# Patient Record
Sex: Male | Born: 1945 | Race: White | Hispanic: No | Smoking: Never smoker
Health system: Southern US, Community
[De-identification: ages and names within clinical notes are randomized; demographics above are authoritative.]

---

## 2019-01-21 ENCOUNTER — Other Ambulatory Visit: Payer: Self-pay

## 2019-01-21 ENCOUNTER — Inpatient Hospital Stay: Payer: Medicare Other

## 2019-01-21 ENCOUNTER — Emergency Department: Payer: Medicare Other

## 2019-01-21 ENCOUNTER — Inpatient Hospital Stay
Admission: EM | Admit: 2019-01-21 | Discharge: 2019-02-03 | DRG: 330 | Disposition: A | Discharge status: Skilled Nursing Facility | Payer: Medicare Other | Attending: Surgery | Admitting: Surgery

## 2019-01-21 DIAGNOSIS — R1084 Generalized abdominal pain: Principal | ICD-10-CM

## 2019-01-21 DIAGNOSIS — R4781 Slurred speech: Secondary | ICD-10-CM | POA: Diagnosis present

## 2019-01-21 DIAGNOSIS — E876 Hypokalemia: Secondary | ICD-10-CM | POA: Diagnosis present

## 2019-01-21 DIAGNOSIS — Z20828 Contact with and (suspected) exposure to other viral communicable diseases: Secondary | ICD-10-CM | POA: Diagnosis present

## 2019-01-21 DIAGNOSIS — I714 Abdominal aortic aneurysm, without rupture, unspecified: Secondary | ICD-10-CM

## 2019-01-21 DIAGNOSIS — K56609 Unspecified intestinal obstruction, unspecified as to partial versus complete obstruction: Secondary | ICD-10-CM | POA: Diagnosis present

## 2019-01-21 DIAGNOSIS — Z0279 Encounter for issue of other medical certificate: Secondary | ICD-10-CM | POA: Diagnosis present

## 2019-01-21 DIAGNOSIS — R188 Other ascites: Secondary | ICD-10-CM | POA: Diagnosis present

## 2019-01-21 DIAGNOSIS — Z5329 Procedure and treatment not carried out because of patient's decision for other reasons: Secondary | ICD-10-CM | POA: Diagnosis present

## 2019-01-21 DIAGNOSIS — Z0189 Encounter for other specified special examinations: Secondary | ICD-10-CM | POA: Diagnosis present

## 2019-01-21 DIAGNOSIS — K660 Peritoneal adhesions (postprocedural) (postinfection): Secondary | ICD-10-CM | POA: Diagnosis present

## 2019-01-21 DIAGNOSIS — K5730 Diverticulosis of large intestine without perforation or abscess without bleeding: Secondary | ICD-10-CM | POA: Diagnosis present

## 2019-01-21 DIAGNOSIS — Z638 Other specified problems related to primary support group: Secondary | ICD-10-CM | POA: Diagnosis not present

## 2019-01-21 DIAGNOSIS — I440 Atrioventricular block, first degree: Secondary | ICD-10-CM | POA: Diagnosis present

## 2019-01-21 DIAGNOSIS — R0902 Hypoxemia: Secondary | ICD-10-CM

## 2019-01-21 DIAGNOSIS — K08109 Complete loss of teeth, unspecified cause, unspecified class: Secondary | ICD-10-CM | POA: Diagnosis present

## 2019-01-21 DIAGNOSIS — Z880 Allergy status to penicillin: Secondary | ICD-10-CM

## 2019-01-21 DIAGNOSIS — I723 Aneurysm of iliac artery: Secondary | ICD-10-CM | POA: Diagnosis present

## 2019-01-21 DIAGNOSIS — K838 Other specified diseases of biliary tract: Secondary | ICD-10-CM | POA: Diagnosis present

## 2019-01-21 DIAGNOSIS — R911 Solitary pulmonary nodule: Secondary | ICD-10-CM | POA: Diagnosis present

## 2019-01-21 DIAGNOSIS — R110 Nausea: Secondary | ICD-10-CM | POA: Diagnosis present

## 2019-01-21 DIAGNOSIS — F4320 Adjustment disorder, unspecified: Secondary | ICD-10-CM | POA: Diagnosis present

## 2019-01-21 DIAGNOSIS — Z590 Homelessness: Secondary | ICD-10-CM | POA: Diagnosis not present

## 2019-01-21 DIAGNOSIS — Z9049 Acquired absence of other specified parts of digestive tract: Secondary | ICD-10-CM | POA: Diagnosis not present

## 2019-01-21 DIAGNOSIS — I959 Hypotension, unspecified: Secondary | ICD-10-CM | POA: Diagnosis not present

## 2019-01-21 DIAGNOSIS — R112 Nausea with vomiting, unspecified: Secondary | ICD-10-CM

## 2019-01-21 LAB — CBC WITH DIFFERENTIAL/PLATELET
Abs Immature Granulocytes: 0.03 10*3/uL (ref 0.00–0.07)
Basophils Absolute: 0 10*3/uL (ref 0.0–0.1)
Basophils Relative: 0 %
Eosinophils Absolute: 0 10*3/uL (ref 0.0–0.5)
Eosinophils Relative: 0 %
HCT: 39 % (ref 39.0–52.0)
Hemoglobin: 12.9 g/dL — ABNORMAL LOW (ref 13.0–17.0)
Immature Granulocytes: 0 %
Lymphocytes Relative: 8 %
Lymphs Abs: 0.6 10*3/uL — ABNORMAL LOW (ref 0.7–4.0)
MCH: 30.4 pg (ref 26.0–34.0)
MCHC: 33.1 g/dL (ref 30.0–36.0)
MCV: 91.8 fL (ref 80.0–100.0)
Monocytes Absolute: 0.4 10*3/uL (ref 0.1–1.0)
Monocytes Relative: 5 %
Neutro Abs: 6.6 10*3/uL (ref 1.7–7.7)
Neutrophils Relative %: 87 %
Platelets: 169 10*3/uL (ref 150–400)
RBC: 4.25 MIL/uL (ref 4.22–5.81)
RDW: 13.5 % (ref 11.5–15.5)
WBC: 7.7 10*3/uL (ref 4.0–10.5)
nRBC: 0 % (ref 0.0–0.2)

## 2019-01-21 LAB — COMPREHENSIVE METABOLIC PANEL
ALT: 22 U/L (ref 0–44)
AST: 25 U/L (ref 15–41)
Albumin: 4 g/dL (ref 3.5–5.0)
Alkaline Phosphatase: 63 U/L (ref 38–126)
Anion gap: 9 (ref 5–15)
BUN: 18 mg/dL (ref 8–23)
CO2: 29 mmol/L (ref 22–32)
Calcium: 9.3 mg/dL (ref 8.9–10.3)
Chloride: 101 mmol/L (ref 98–111)
Creatinine, Ser: 0.99 mg/dL (ref 0.61–1.24)
GFR calc Af Amer: >60 mL/min (ref >60)
GFR calc non Af Amer: >60 mL/min (ref >60)
Glucose, Bld: 145 mg/dL — ABNORMAL HIGH (ref 70–99)
Potassium: 3.1 mmol/L — ABNORMAL LOW (ref 3.5–5.1)
Sodium: 139 mmol/L (ref 135–145)
Total Bilirubin: 1.1 mg/dL (ref 0.3–1.2)
Total Protein: 6.9 g/dL (ref 6.5–8.1)

## 2019-01-21 LAB — URINE DRUG SCREEN, QUALITATIVE (ARMC ONLY)
Amphetamines, Ur Screen: NOT DETECTED
Barbiturates, Ur Screen: NOT DETECTED
Benzodiazepine, Ur Scrn: NOT DETECTED
Cannabinoid 50 Ng, Ur ~~LOC~~: NOT DETECTED
Cocaine Metabolite,Ur ~~LOC~~: NOT DETECTED
MDMA (Ecstasy)Ur Screen: NOT DETECTED
Methadone Scn, Ur: NOT DETECTED
Opiate, Ur Screen: POSITIVE — AB
Phencyclidine (PCP) Ur S: NOT DETECTED
Tricyclic, Ur Screen: NOT DETECTED

## 2019-01-21 LAB — ETHANOL: Alcohol, Ethyl (B): <10 mg/dL (ref <10)

## 2019-01-21 LAB — LIPASE, BLOOD: Lipase: 28 U/L (ref 11–51)

## 2019-01-21 IMAGING — CT CT ABD-PELV W/O CM
2 of 4 series · 15 of 46 positions shown, 17 images · non-contrast
Comparison: None.

CLINICAL DATA: Onset vomiting today.

EXAM:
CT ABDOMEN AND PELVIS WITHOUT CONTRAST
TECHNIQUE: Multidetector CT imaging of the abdomen and pelvis was performed
following the standard protocol without IV contrast.

[Series 2: routine abd/pel wo · axial · 0.67mm/px · z∈[-442,+3]mm · 12 of 99 slices shown, 14 images]
[im 5/99  soft-tissue]
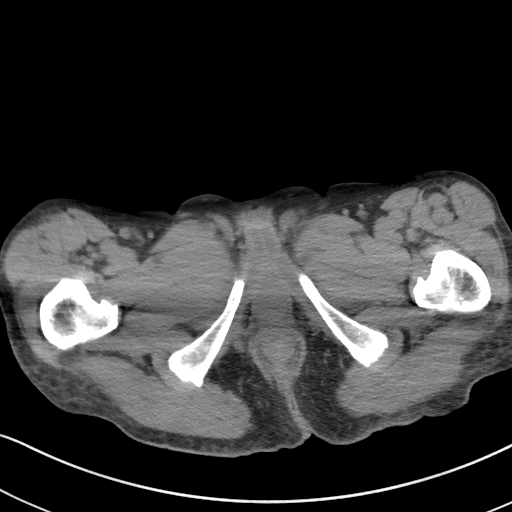
[im 5/99  bone]
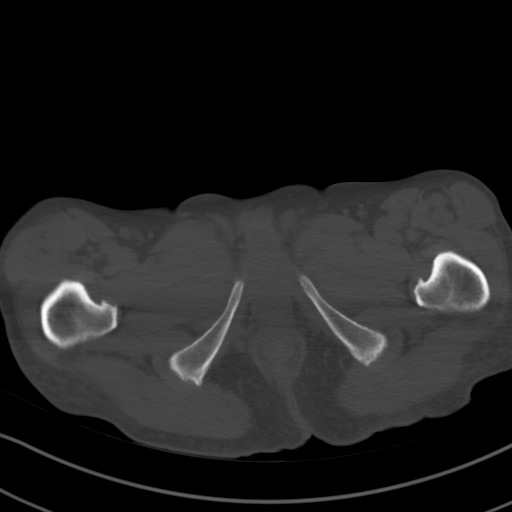
[im 13/99  soft-tissue]
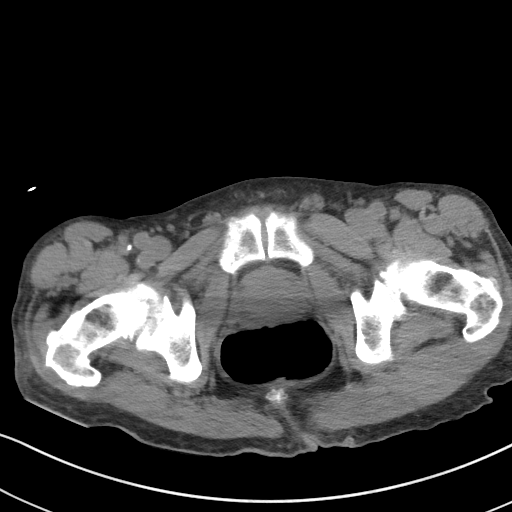
[im 21/99  soft-tissue]
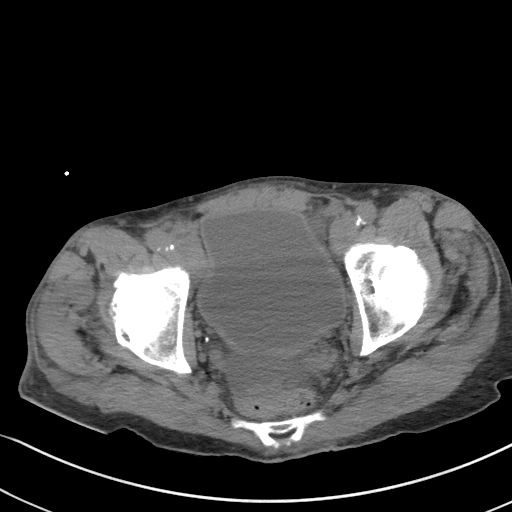
[im 29/99  soft-tissue]
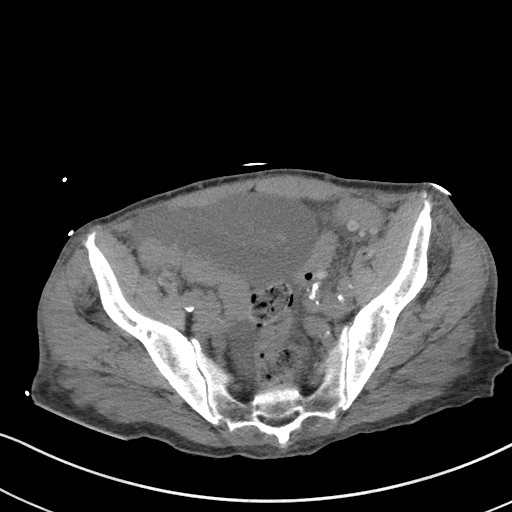
[im 37/99  soft-tissue]
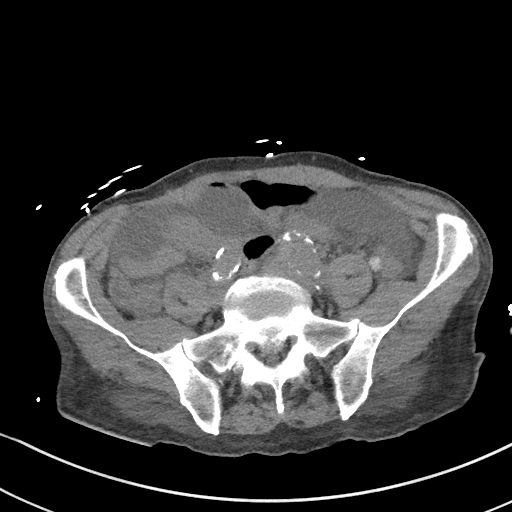
[im 45/99  soft-tissue]
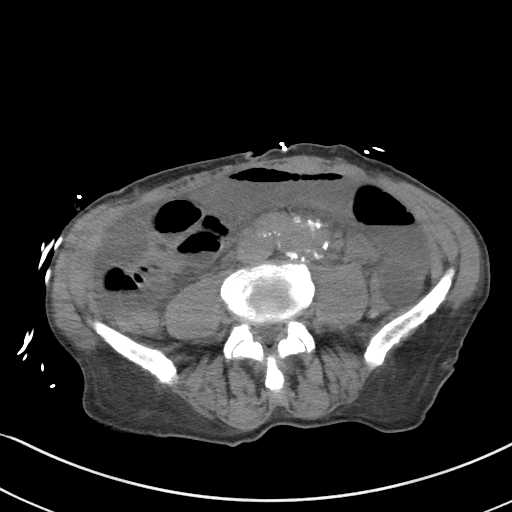
[im 54/99  soft-tissue]
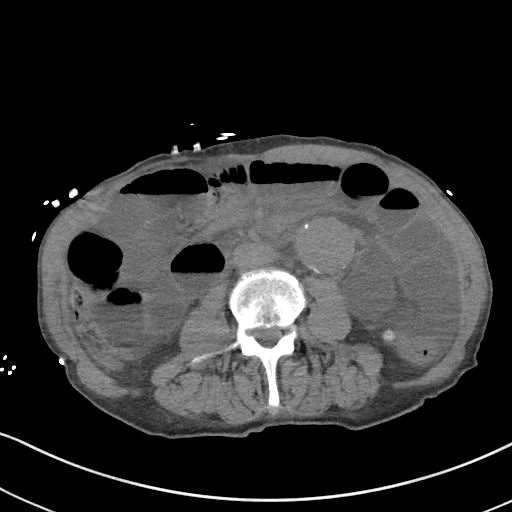
[im 62/99  soft-tissue]
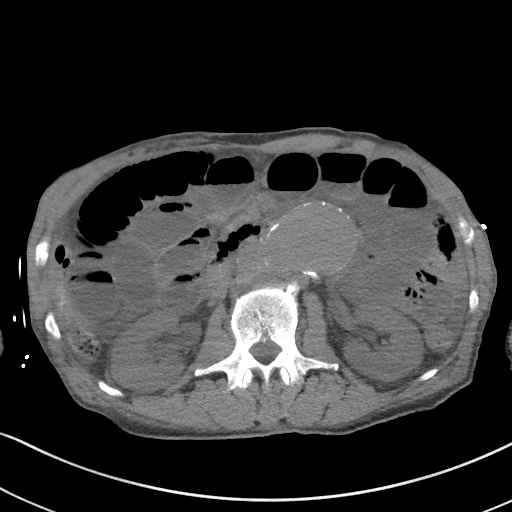
[im 70/99  soft-tissue]
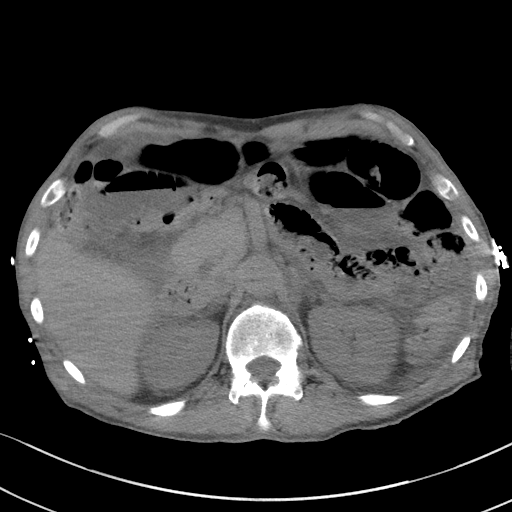
[im 70/99  bone]
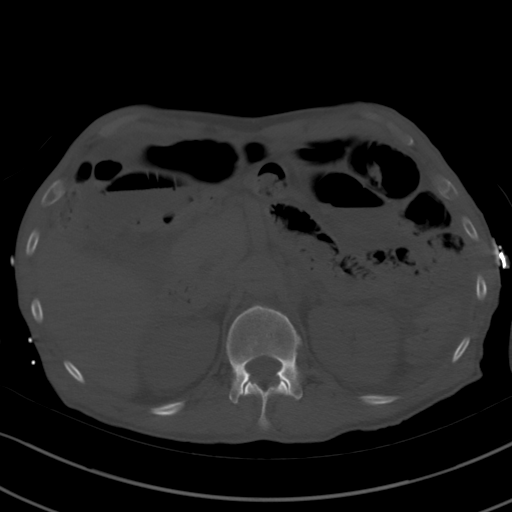
[im 78/99  soft-tissue]
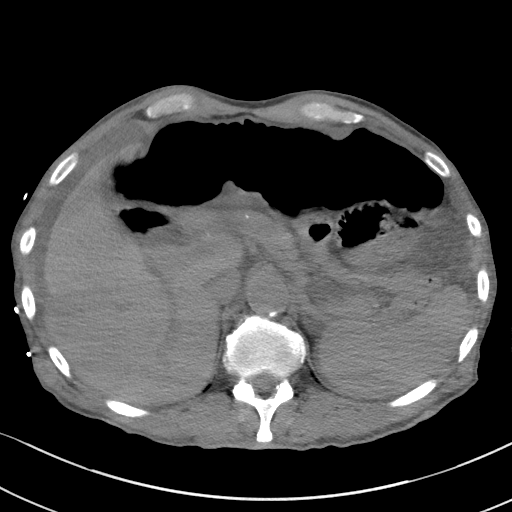
[im 86/99  soft-tissue]
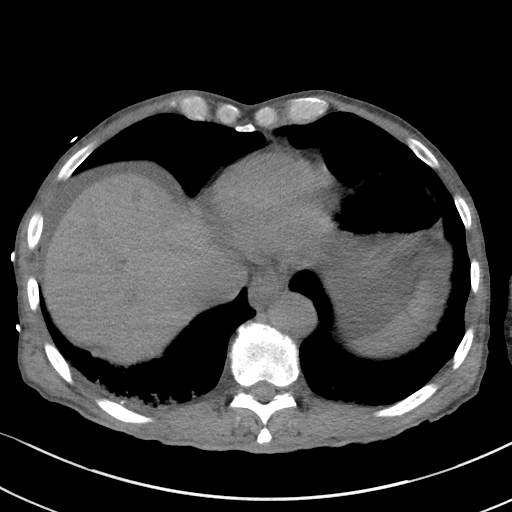
[im 94/99  soft-tissue]
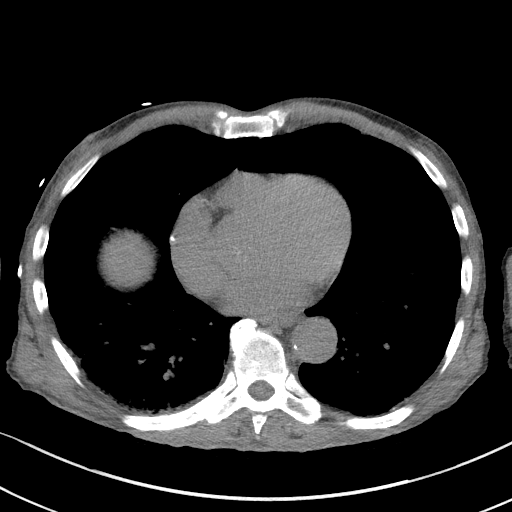

[Series 5: coronal st · coronal · 0.84mm/px · 3 of 90 slices shown]
[im 30/90  soft-tissue]
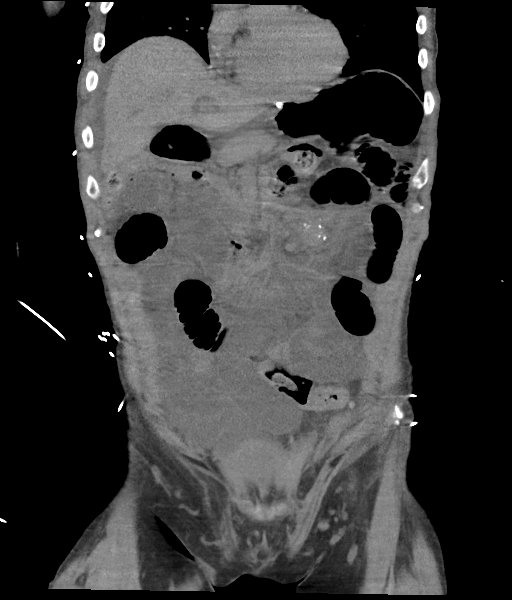
[im 40/90  soft-tissue]
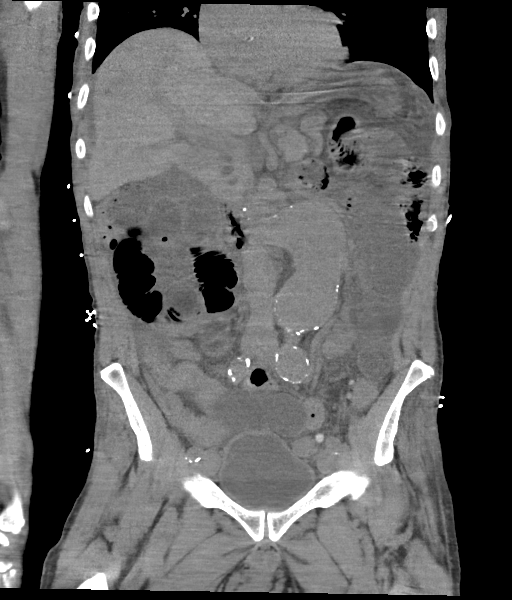
[im 50/90  soft-tissue]
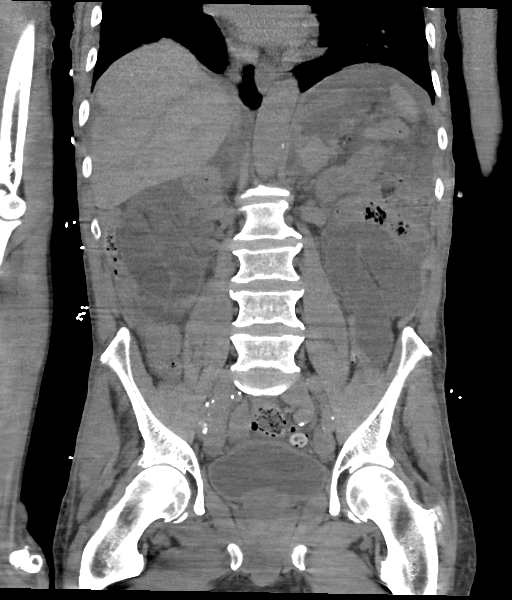

[15 of 46 positions shown; findings below may reference images not displayed]

FINDINGS: Lower chest: Lung bases demonstrate emphysema. There is some
dependent atelectasis. A right lower lobe nodule measuring 1.9 cm in
diameter is seen on image 12 of series 3. The lesion is unchanged
since the [DATE] CT. Punctate calcified granulomata are also
seen in both lower lobes.

Hepatobiliary: No focal liver lesion. The gallbladder is difficult
to visualize and may be completely decompressed. No surgical history
is provided but the patient could be status post cholecystectomy.

Pancreas: Unremarkable. No pancreatic ductal dilatation or
surrounding inflammatory changes.

Spleen: Normal in size without focal abnormality.

Adrenals/Urinary Tract: There is thickening of the adrenal glands
bilaterally compatible with hyperplasia. The kidneys and ureters are
unremarkable. Urinary bladder appears normal.

Stomach/Bowel: Small bowel loops are dilated up to approximately 4
cm with air-fluid levels present. Distal small bowel loops are
decompressed. No pneumatosis, portal venous gas or free
intraperitoneal air is identified. Transition point is not
identified. The colon is largely decompressed. Scattered diverticula
are noted without diverticulitis. The stomach is unremarkable.

Vascular/Lymphatic: Extensive aortic atherosclerosis. The aorta is
markedly tortuous aortic aneurysm measuring up to 5.2 cm in
diameter. Left common iliac artery aneurysm measures 2.6 cm and the
right common iliac artery measures 1.6 cm. No evidence of leak.

Reproductive: Prostate gland is unremarkable.

Other: There is a small volume abdominal and pelvic ascites. No free
intraperitoneal air.

Musculoskeletal: No acute or focal bony abnormality. Right worse
than left hip osteoarthritis is noted.
IMPRESSION: Findings most consistent with small bowel obstruction. No CT signs
of ischemia on uninfused exam.

Small volume of ascites is presumably related to bowel obstruction.

5.2 cm abdominal aortic aneurysm. No evidence of hemorrhage.
Bilateral common iliac artery aneurysms are also seen. Recommend
followup by abdomen and pelvis CTA in 3-6 months, and vascular
surgery referral/consultation if not already obtained. This
recommendation follows ACR consensus guidelines: White Paper of the
ACR Incidental Findings Committee II on Vascular Findings. [HOSPITAL] [AY]; [DATE]. Aortic aneurysm NOS ([AY]-[AY])

1.9 cm in diameter right lower lobe pulmonary nodule is unchanged
since [DATE]. Recommend chest CT scan in 6-12 months.

Emphysema.

## 2019-01-21 MED ORDER — LACTATED RINGERS IV SOLN
INTRAVENOUS | Status: DC
  Administered 2019-01-21 – 2019-01-22 (×3): via INTRAVENOUS

## 2019-01-21 MED ORDER — LIDOCAINE HCL (PF) 1 % IJ SOLN
5.0000 mL | Freq: Once | INTRAMUSCULAR | Status: AC
  Administered 2019-01-21: 5 mL
  Filled 2019-01-21: qty 5

## 2019-01-21 MED ORDER — ONDANSETRON HCL 4 MG/2ML IJ SOLN
4.0000 mg | Freq: Four times a day (QID) | INTRAMUSCULAR | Status: DC | PRN
  Administered 2019-01-22 – 2019-01-29 (×6): 4 mg via INTRAVENOUS
  Filled 2019-01-21 (×6): qty 2

## 2019-01-21 MED ORDER — ENOXAPARIN SODIUM 40 MG/0.4ML ~~LOC~~ SOLN
40.0000 mg | SUBCUTANEOUS | Status: DC
  Administered 2019-01-22 – 2019-01-25 (×3): 40 mg via SUBCUTANEOUS
  Filled 2019-01-21 (×4): qty 0.4

## 2019-01-21 MED ORDER — ONDANSETRON 4 MG PO TBDP: 4.0000 mg | ORAL_TABLET | Freq: Four times a day (QID) | ORAL | Status: DC | PRN

## 2019-01-21 MED ORDER — IOHEXOL 350 MG/ML SOLN
100.0000 mL | Freq: Once | INTRAVENOUS | Status: AC | PRN
  Administered 2019-01-21: 100 mL via INTRAVENOUS

## 2019-01-21 MED ORDER — DIATRIZOATE MEGLUMINE & SODIUM 66-10 % PO SOLN: 90.0000 mL | Freq: Once | ORAL | Status: DC

## 2019-01-21 MED ORDER — LORAZEPAM 2 MG/ML IJ SOLN
2.0000 mg | Freq: Once | INTRAMUSCULAR | Status: AC
  Administered 2019-01-21: 2 mg via INTRAVENOUS
  Filled 2019-01-21: qty 1

## 2019-01-21 MED ORDER — ONDANSETRON HCL 4 MG/2ML IJ SOLN
4.0000 mg | Freq: Once | INTRAMUSCULAR | Status: AC
  Administered 2019-01-21: 4 mg via INTRAVENOUS
  Filled 2019-01-21: qty 2

## 2019-01-21 MED ORDER — MORPHINE SULFATE (PF) 4 MG/ML IV SOLN
4.0000 mg | Freq: Once | INTRAVENOUS | Status: AC
  Administered 2019-01-21: 4 mg via INTRAVENOUS
  Filled 2019-01-21: qty 1

## 2019-01-21 MED ORDER — HALOPERIDOL LACTATE 5 MG/ML IJ SOLN
5.0000 mg | Freq: Once | INTRAMUSCULAR | Status: AC
  Administered 2019-01-21: 5 mg via INTRAVENOUS
  Filled 2019-01-21: qty 1

## 2019-01-21 NOTE — ED Provider Notes (Signed)
Procedures  Clinical Course as of Jan 20 1210  Wed Jan 21, 2019  1159 Seen by surgery who requests hospitalist admission due to other health issues.  Patient is refusing care, and I reassessed him and discussed the current findings with him.  He has limited insight, does not appear to be able to make a rational decision regarding his care.  It is appropriate to continue involuntary commitment and continue treatment despite his refusal at this time as he does not appear to have decision-making capacity and is likely to suffer substantial harm and even death by delaying or denying care.   [PS]    Clinical Course User Index [PS] Carrie Mew, MD   Final diagnoses:  Generalized abdominal pain  Non-intractable vomiting with nausea, unspecified vomiting type  Small bowel obstruction ([place])  Abdominal aortic aneurysm (AAA) without rupture [facility])      Carrie Mew, MD 01/21/19 1215

## 2019-01-21 NOTE — ED Notes (Signed)
Attempted to give report to floor RN. RN currently finishing up on shift report with previous shift and would call back for report when done. Gave name and direct number to this Probation officer for call back.

## 2019-01-21 NOTE — ED Notes (Signed)
Pt will be admitted after 7pm/ for Surgery

## 2019-01-21 NOTE — ED Notes (Addendum)
Pt hit call bell, this RN to bedside, asked pt what he needed help with, pt states "fuck you leave me alone." Pt does not appear to be in any distress other than agitated, pt on monitor, VSS. Pt continuing to yell and cuss from bed.

## 2019-01-21 NOTE — ED Notes (Signed)
Pt refusing all care, MD Joni Fears made aware, per MD pt is IVC and is not in right mind at this time to make medical decisions

## 2019-01-21 NOTE — ED Notes (Addendum)
Pt jerked away and refused nasal swab for covid , pt cursing and verbally abusive at this time with RN ,

## 2019-01-21 NOTE — ED Notes (Signed)
Received report from [place] RN. Patient currently resting with eyes closed. Will continue to monitor.

## 2019-01-21 NOTE — ED Notes (Addendum)
Pt repositioned in bed, pt head elevated, NAD noted. Will continue to monitor

## 2019-01-21 NOTE — ED Notes (Signed)
Surgeon at bedside.  

## 2019-01-21 NOTE — ED Notes (Signed)
This RN went to reposition pt in bed, updated pt on awaiting surgeon to bedside. Pt immediately became verbally aggressive screaming " I do not want surgery and I'm done talking about it". PT then asked to slide up in bed, pt calmly cooperated. Will continue to monitor

## 2019-01-21 NOTE — ED Notes (Signed)
Pt verbally aggressive when giving meds but did not psychically become combative w/ this RN and Gregor Hams , pt let this RN push med through IV

## 2019-01-21 NOTE — ED Notes (Signed)
Pt urinated on self, refusing to be cleaned at this time. States leave me alone.

## 2019-01-21 NOTE — ED Notes (Signed)
Report called to RN Destiny.

## 2019-01-21 NOTE — ED Notes (Signed)
ED TO INPATIENT HANDOFF REPORT  ED Nurse Name and Phone #: [place] [phone]  S Name/Age/Gender Edward Mclaughlin 73 y.o. male Room/Bed: ED24A/ED24A  Code Status   Code Status: Not on file  Home/SNF/Other Home Patient oriented to: self, place, time and situation Is this baseline? Yes   Triage Complete: Triage complete  Chief Complaint IVC  Triage Note Pt to ED via [facility] where pt was found throwing up in bathroom, per pd pt was stating "I am going to die". Pt arrives easily agitated, stating he just wants to rest. Pt denies pain. VSS. Pt under IVC from [place] [employer], officer at bedside.    Allergies Not on File  Level of Care/Admitting Diagnosis ED Disposition    ED Disposition Condition Comment   Admit  Hospital Area: [facility] [[id number]]  Level of Care: Med-Surg [16]  Covid Evaluation: Asymptomatic Screening Protocol (No Symptoms)  Diagnosis: SBO (small bowel obstruction) [facility]) [[id number]]  Admitting Physician: Sung Amabile [[id number]]  Attending Physician: Sung Amabile [phone]  Estimated length of stay: 3 - 4 days  Certification:: I certify this patient will need inpatient services for at least 2 midnights  PT Class (Do Not Modify): Inpatient [101]  PT Acc Code (Do Not Modify): Private [1]       B Medical/Surgery History History reviewed. No pertinent past medical history. History reviewed. No pertinent surgical history.   A IV Location/Drains/Wounds Patient Lines/Drains/Airways Status   Active Line/Drains/Airways    Name:   Placement date:   Placement time:   Site:   Days:   Peripheral IV 01/21/19 Right Antecubital   01/21/19    0729    Antecubital   less than 1          Intake/Output Last 24 hours  Intake/Output Summary (Last 24 hours) at 01/21/2019 1853 Last data filed at 01/21/2019 1748 Gross per 24 hour  Intake -  Output 400 ml  Net -400 ml    Labs/Imaging Results for orders placed or performed during the  hospital encounter of 01/21/19 (from the past 48 hour(s))  Ethanol     Status: None   Collection Time: 01/21/19  3:06 AM  Result Value Ref Range   Alcohol, Ethyl (B) <10 <10 mg/dL    Comment: (NOTE) Lowest detectable limit for serum alcohol is 10 mg/dL. For medical purposes only. Performed at [facility], [address] [place], [place] [address]   CBC with Differential/Platelet     Status: Abnormal   Collection Time: 01/21/19  3:06 AM  Result Value Ref Range   WBC 7.7 4.0 - 10.5 K/uL   RBC 4.25 4.22 - 5.81 MIL/uL   Hemoglobin 12.9 (L) 13.0 - 17.0 g/dL   HCT 92.3 30.0 - 76.2 %   MCV 91.8 80.0 - 100.0 fL   MCH 30.4 26.0 - 34.0 pg   MCHC 33.1 30.0 - 36.0 g/dL   RDW 26.3 33.5 - 45.6 %   Platelets 169 150 - 400 K/uL   nRBC 0.0 0.0 - 0.2 %   Neutrophils Relative % 87 %   Neutro Abs 6.6 1.7 - 7.7 K/uL   Lymphocytes Relative 8 %   Lymphs Abs 0.6 (L) 0.7 - 4.0 K/uL   Monocytes Relative 5 %   Monocytes Absolute 0.4 0.1 - 1.0 K/uL   Eosinophils Relative 0 %   Eosinophils Absolute 0.0 0.0 - 0.5 K/uL   Basophils Relative 0 %   Basophils Absolute 0.0 0.0 - 0.1 K/uL  Immature Granulocytes 0 %   Abs Immature Granulocytes 0.03 0.00 - 0.07 K/uL    Comment: Performed at [facility], [address] [place], [place] [address]  Comprehensive metabolic panel     Status: Abnormal   Collection Time: 01/21/19  3:06 AM  Result Value Ref Range   Sodium 139 135 - 145 mmol/L   Potassium 3.1 (L) 3.5 - 5.1 mmol/L   Chloride 101 98 - 111 mmol/L   CO2 29 22 - 32 mmol/L   Glucose, Bld 145 (H) 70 - 99 mg/dL   BUN 18 8 - 23 mg/dL   Creatinine, Ser 0.99 0.61 - 1.24 mg/dL   Calcium 9.3 8.9 - 10.3 mg/dL   Total Protein 6.9 6.5 - 8.1 g/dL   Albumin 4.0 3.5 - 5.0 g/dL   AST 25 15 - 41 U/L   ALT 22 0 - 44 U/L   Alkaline Phosphatase 63 38 - 126 U/L   Total Bilirubin 1.1 0.3 - 1.2 mg/dL   GFR calc non Af Amer >60 >60 mL/min   GFR calc Af Amer >60 >60 mL/min   Anion gap 9 5 - 15     Comment: Performed at [facility], [address] [place], [place] [address]  Lipase, blood     Status: None   Collection Time: 01/21/19  3:06 AM  Result Value Ref Range   Lipase 28 11 - 51 U/L    Comment: Performed at [facility], [address], [place], [place] [address]  Urine Drug Screen, Qualitative ([facility] only)     Status: Abnormal   Collection Time: 01/21/19  5:49 PM  Result Value Ref Range   Tricyclic, Ur Screen NONE DETECTED NONE DETECTED   Amphetamines, Ur Screen NONE DETECTED NONE DETECTED   MDMA (Ecstasy)Ur Screen NONE DETECTED NONE DETECTED   Cocaine Metabolite,Ur [place] NONE DETECTED NONE DETECTED   Opiate, Ur Screen POSITIVE (A) NONE DETECTED   Phencyclidine (PCP) Ur S NONE DETECTED NONE DETECTED   Cannabinoid 50 Ng, Ur [place] NONE DETECTED NONE DETECTED   Barbiturates, Ur Screen NONE DETECTED NONE DETECTED   Benzodiazepine, Ur Scrn NONE DETECTED NONE DETECTED   Methadone Scn, Ur NONE DETECTED NONE DETECTED    Comment: (NOTE) Tricyclics + metabolites, urine    Cutoff 1000 ng/mL Amphetamines + metabolites, urine  Cutoff 1000 ng/mL MDMA (Ecstasy), urine              Cutoff 500 ng/mL Cocaine Metabolite, urine          Cutoff 300 ng/mL Opiate + metabolites, urine        Cutoff 300 ng/mL Phencyclidine (PCP), urine         Cutoff 25 ng/mL Cannabinoid, urine                 Cutoff 50 ng/mL Barbiturates + metabolites, urine  Cutoff 200 ng/mL Benzodiazepine, urine              Cutoff 200 ng/mL Methadone, urine                   Cutoff 300 ng/mL The urine drug screen provides only a preliminary, unconfirmed analytical test result and should not be used for non-medical purposes. Clinical consideration and professional judgment should be applied to any positive drug screen result due to possible interfering substances. A more specific alternate chemical method must be used in order to obtain a confirmed analytical result. Gas chromatography / mass  spectrometry (GC/MS) is  the preferred confirmat ory method. Performed at [facility], [address] [place], [place] [id number]    Ct Abdomen Pelvis Wo Contrast  Result Date: 01/21/2019 CLINICAL DATA:  Onset vomiting today. EXAM: CT ABDOMEN AND PELVIS WITHOUT CONTRAST TECHNIQUE: Multidetector CT imaging of the abdomen and pelvis was performed following the standard protocol without IV contrast. COMPARISON:  None. FINDINGS: Lower chest: Lung bases demonstrate emphysema. There is some dependent atelectasis. A right lower lobe nodule measuring 1.9 cm in diameter is seen on image 12 of series 3. The lesion is unchanged since the 04/30/2018 CT. Punctate calcified granulomata are also seen in both lower lobes. Hepatobiliary: No focal liver lesion. The gallbladder is difficult to visualize and may be completely decompressed. No surgical history is provided but the patient could be status post cholecystectomy. Pancreas: Unremarkable. No pancreatic ductal dilatation or surrounding inflammatory changes. Spleen: Normal in size without focal abnormality. Adrenals/Urinary Tract: There is thickening of the adrenal glands bilaterally compatible with hyperplasia. The kidneys and ureters are unremarkable. Urinary bladder appears normal. Stomach/Bowel: Small bowel loops are dilated up to approximately 4 cm with air-fluid levels present. Distal small bowel loops are decompressed. No pneumatosis, portal venous gas or free intraperitoneal air is identified. Transition point is not identified. The colon is largely decompressed. Scattered diverticula are noted without diverticulitis. The stomach is unremarkable. Vascular/Lymphatic: Extensive aortic atherosclerosis. The aorta is markedly tortuous aortic aneurysm measuring up to 5.2 cm in diameter. Left common iliac artery aneurysm measures 2.6 cm and the right common iliac artery measures 1.6 cm. No evidence of leak. Reproductive: Prostate gland is unremarkable.  Other: There is a small volume abdominal and pelvic ascites. No free intraperitoneal air. Musculoskeletal: No acute or focal bony abnormality. Right worse than left hip osteoarthritis is noted. IMPRESSION: Findings most consistent with small bowel obstruction. No CT signs of ischemia on uninfused exam. Small volume of ascites is presumably related to bowel obstruction. 5.2 cm abdominal aortic aneurysm. No evidence of hemorrhage. 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 Recommend followup by abdomen and pelvis CTA in 3-6 months, and vascular surgery referral/consultation if not already obtained. This recommendation follows ACR consensus guidelines: White Paper of the ACR Incidental Findings Committee II on Vascular Findings. J Am Coll Radiol 2013; 10:789-794. Aortic aneurysm NOS (ICD10-I71.9) 1.9 cm in diameter right lower lobe pulmonary nodule is unchanged since January, 2020. Recommend chest CT scan in 6-12 months. Emphysema. Electronically Signed   By: Drusilla Kannerhomas  Dalessio M.D.   On: 01/21/2019 08:07   Ct Head Wo Contrast  Result Date: 01/21/2019 CLINICAL DATA:  Vomiting, agitated. EXAM: CT HEAD WITHOUT CONTRAST TECHNIQUE: Contiguous axial images were obtained from the base of the skull through the vertex without intravenous contrast. COMPARISON:  None. FINDINGS: Brain: There is atrophy and chronic small vessel disease changes. No acute intracranial abnormality. Specifically, no hemorrhage, hydrocephalus, mass lesion, acute infarction, or significant intracranial injury. Vascular: No hyperdense vessel or unexpected calcification. Skull: No acute calvarial abnormality. Sinuses/Orbits: No acute finding Other: None IMPRESSION: Atrophy, chronic microvascular disease. No acute intracranial abnormality. Electronically Signed   By: Charlett NoseKevin  Dover M.D.   On: 01/21/2019 07:47   Ct Angio Abd/pel W/ And/or W/o  Result Date: 01/21/2019 CLINICAL DATA:  Emesis . aortic aneurysm. EXAM: CTA ABDOMEN AND PELVIS  WITH CONTRAST TECHNIQUE: Multidetector CT imaging of the abdomen and pelvis was performed using the standard protocol during bolus administration of intravenous contrast. Multiplanar reconstructed images and MIPs were obtained and reviewed to evaluate the  vascular anatomy. CONTRAST:  OMNIPAQUE IOHEXOL 350 MG/ML SOLN COMPARISON:  Noncontrast study from earlier the same day FINDINGS: VASCULAR Aorta: Moderate scattered calcified atheromatous plaque. Tortuous infrarenal aorta with bilobed fusiform aneurysm. Proximal moiety measures 5 cm maximum transverse diameter, distal moiety 4.3 cm, extending to the bifurcation. No evidence of leak or impending rupture. No significant intraluminal thrombus. No dissection or stenosis. Celiac: Patent without evidence of aneurysm, dissection, vasculitis or significant stenosis. SMA: Patent without evidence of aneurysm, dissection, vasculitis or significant stenosis. Renals: Single right, with partially calcified ostial plaque, no high-grade stenosis. 3 left renal arteries, inferior dominant, without high-grade stenosis. IMA: Patent without evidence of aneurysm, dissection, vasculitis or significant stenosis. Inflow: On the right, proximal common iliac ectatic up to 1.7 cm diameter with some scattered calcified plaque. External and internal iliac branches ectatic, mildly tortuous. No dissection or stenosis. On the left, fusiform dilatation of distal common iliac up to 2.6 cm diameter. Ectatic tortuous internal and external iliac branches. No dissection or stenosis. Proximal Outflow: Ectatic, mildly atheromatous, patent. Veins: Patent hepatic veins, portal vein, SMV, IMV, splenic vein, bilateral renal veins, IVC, and iliac venous system. No venous pathology identified. Review of the MIP images confirms the above findings. NON-VASCULAR Lower chest: 1.7 cm lobulated nodule in the lateral basal segment right lower lobe, stable since 04/30/2018. No pleural or pericardial effusion.  Hepatobiliary: No liver lesion. Progressive central intrahepatic and extrahepatic biliary ductal dilatation since 04/30/2018, CBD seen down to the level of the ampulla. Pancreas: Progressive pancreatic ductal dilatation since 04/30/2018, seen down to the level of the ampulla, without discrete pancreatic lesion. Spleen: Normal in size without focal abnormality. Adrenals/Urinary Tract: Bilateral adrenal hyperplasia. Probable small cysts in the upper pole right kidney and lower pole left kidney. No hydronephrosis. Urinary bladder is physiologically distended. Stomach/Bowel: Mild gaseous distention of stomach. Distended fluid-filled proximal and mid small bowel loops, with multiple decompressed distal small bowel loops, no discrete transition point identified. No definite wall thickening. The colon is nondistended. Scattered descending and sigmoid diverticula without definite adjacent inflammatory/edematous change or abscess. Lymphatic: No definite abdominal or pelvic adenopathy. Reproductive: Mild prostate enlargement with central coarse calcifications. Other: Small volume abdominal and pelvic ascites, new since 04/30/2018. No free air. Musculoskeletal: Spondylitic changes in the lower lumbar spine. Hip DJD right greater than left. No fracture or worrisome bone lesion. IMPRESSION: 1. Mid small bowel obstruction without evident etiology, suggesting adhesions. 2. Progressive intrahepatic and extrahepatic biliary ductal dilatation since 04/30/2018, without discrete pancreatic lesion. Correlate with any clinical or laboratory evidence of biliary obstruction, and consider ERCP or MRCP for further evaluation if clinically indicated. 3. New small volume abdominal and pelvic ascites. 4. 5 cm fusiform infrarenal abdominal aortic aneurysm without complicating features. Recommend followup by abdomen and pelvis CTA in 3-6 months, and vascular surgery referral/consultation if not already obtained. This recommendation follows ACR  consensus guidelines: White Paper of the ACR Incidental Findings Committee II on Vascular Findings. J Am Coll Radiol 2013; 10:789-794. 5. 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 6. Stable 1.7 cm right lower lobe pulmonary nodule since 04/30/2018. Recommend 1 year follow-up CT. 7. Descending and sigmoid diverticulosis. Electronically Signed   By: Corlis Leak M.D.   On: 01/21/2019 08:58    Pending Labs Unresulted Labs (From admission, onward)    Start     Ordered   01/21/19 1322  SARS CORONAVIRUS 2 (TAT 6-24 HRS) Nasopharyngeal Nasopharyngeal Swab  (Asymptomatic/Tier 2 Patients Labs)  Once,   STAT  Question Answer Comment  Is this test for diagnosis or screening Screening   Symptomatic for COVID-19 as defined by CDC No   Hospitalized for COVID-19 No   Admitted to ICU for COVID-19 No   Previously tested for COVID-19 No   Resident in a congregate (group) care setting No   Employed in healthcare setting No      01/21/19 1321   Signed and Held  Basic metabolic panel  Daily,   R     Signed and Held   Signed and Held  Magnesium  Daily,   R     Signed and Held   Signed and Held  Phosphorus  Daily,   R     Signed and Held   Signed and Held  CBC  Daily,   R     Signed and Held   Signed and Held  Hepatic function panel  Daily,   R     Signed and Held          Vitals/Pain Today's Vitals   01/21/19 1600 01/21/19 1730 01/21/19 1800 01/21/19 1830  BP: (!) 163/90 (!) 159/95    Pulse: 91 79 81 77  Resp:  18    Temp:      TempSrc:      SpO2: 93% 95% 93% 95%  Weight:      Height:      PainSc:        Isolation Precautions No active isolations  Medications Medications  lactated ringers infusion ( Intravenous New Bag/Given 01/21/19 1606)  morphine 4 MG/ML injection 4 mg (4 mg Intravenous Given 01/21/19 0733)  ondansetron (ZOFRAN) injection 4 mg (4 mg Intravenous Given 01/21/19 0732)  iohexol (OMNIPAQUE) 350 MG/ML injection 100 mL (100 mLs Intravenous Contrast Given 01/21/19 0754)   lidocaine (PF) (XYLOCAINE) 1 % injection 5 mL (5 mLs Other Given 01/21/19 1037)  haloperidol lactate (HALDOL) injection 5 mg (5 mg Intravenous Given 01/21/19 1201)  LORazepam (ATIVAN) injection 2 mg (2 mg Intravenous Given 01/21/19 1300)    Mobility walks High fall risk   Focused Assessments   R Recommendations: See Admitting Provider Note  Report given to:   Additional Notes:

## 2019-01-21 NOTE — ED Notes (Addendum)
Attempted to swab pt nose, pt screaming and refusing nasal swab ,. Will try to encourage pt again at a later time

## 2019-01-21 NOTE — ED Notes (Addendum)
Unable to placed NG tube due to pt refusal, Charge RN and Dr. Lysle Pearl aware

## 2019-01-21 NOTE — ED Notes (Signed)
Pt transported to CT with [employer]

## 2019-01-21 NOTE — ED Notes (Signed)
Snack tray provided.  Pt sleeping

## 2019-01-21 NOTE — Progress Notes (Signed)
This Probation officer and admission nurse attempted to place NG tube but patient refused after several trials.

## 2019-01-21 NOTE — ED Notes (Signed)
Condom cath placed on pt 

## 2019-01-21 NOTE — ED Notes (Signed)
COVID swab complete. This Probation officer explained to patient that is was needed and once obtained would be moving to more comfortable bed.

## 2019-01-21 NOTE — H&P (Signed)
Subjective:   CC: Small bowel obstruction  HPI:  Edward Mclaughlin is a 73 y.o. male who was consulted by [facility] for issue above.  HPI collected from previous notes and discussion with ER provider, since patient is currently unable to provide any coherent answers to questions.  Patient supposedly was found roaming the local [place] station with episodes of emesis.  Brought into hospital for further evaluation, including a CT scan which showed small bowel obstruction.  Surgery consulted for further management.  When attempting to ask questions patient is refusing to answer any.  He states that people are out to "kill" him.      Past Medical History: Unable to obtain secondary to patient mentation  Past Surgical History: Unable to obtain secondary to patient mentation   Family History: Unable to obtain secondary to patient mentation   Social History:  reports that he has never smoked. He has never used smokeless tobacco. He reports previous alcohol use. He reports previous drug use.  Current Medications: Unable to obtain secondary to patient mentation   Allergies:  Allergies as of 01/21/2019  . (Not on File)    ROS:  Unable to obtain secondary to patient mentation    Objective:     BP (!) 163/90   Pulse 91   Temp (!) 97.5 F (36.4 C) (Oral)   Resp 17   Ht 5\' 10"  (1.778 m)   Wt 81.6 kg   SpO2 93%   BMI 25.83 kg/m   Constitutional :  alert, uncooperative and Disheveled   Patient refusing any physical exam at this time.                         LABS:  CMP Latest Ref Rng & Units 01/21/2019  Glucose 70 - 99 mg/dL 01/23/2019)  BUN 8 - 23 mg/dL 18  Creatinine 109(N - 2.35 mg/dL 5.73  Sodium 2.20 - 254 mmol/L 139  Potassium 3.5 - 5.1 mmol/L 3.1(L)  Chloride 98 - 111 mmol/L 101  CO2 22 - 32 mmol/L 29  Calcium 8.9 - 10.3 mg/dL 9.3  Total Protein 6.5 - 8.1 g/dL 6.9  Total Bilirubin 0.3 - 1.2 mg/dL 1.1  Alkaline Phos 38 - 126 U/L 63  AST 15 - 41 U/L 25  ALT 0 - 44  U/L 22   CBC Latest Ref Rng & Units 01/21/2019  WBC 4.0 - 10.5 K/uL 7.7  Hemoglobin 13.0 - 17.0 g/dL 12.9(L)  Hematocrit 39.0 - 52.0 % 39.0  Platelets 150 - 400 K/uL 169    RADS: CLINICAL DATA:  Emesis . aortic aneurysm.  EXAM: CTA ABDOMEN AND PELVIS WITH CONTRAST  TECHNIQUE: Multidetector CT imaging of the abdomen and pelvis was performed using the standard protocol during bolus administration of intravenous contrast. Multiplanar reconstructed images and MIPs were obtained and reviewed to evaluate the vascular anatomy.  CONTRAST:  01/23/2019 OMNIPAQUE IOHEXOL 350 MG/ML SOLN  COMPARISON:  Noncontrast study from earlier the same day  FINDINGS: VASCULAR  Aorta: Moderate scattered calcified atheromatous plaque. Tortuous infrarenal aorta with bilobed fusiform aneurysm. Proximal moiety measures 5 cm maximum transverse diameter, distal moiety 4.3 cm, extending to the bifurcation. No evidence of leak or impending rupture. No significant intraluminal thrombus. No dissection or stenosis.  Celiac: Patent without evidence of aneurysm, dissection, vasculitis or significant stenosis.  SMA: Patent without evidence of aneurysm, dissection, vasculitis or significant stenosis.  Renals: Single right, with partially calcified ostial plaque, no high-grade stenosis.  3 left renal  arteries, inferior dominant, without high-grade stenosis.  IMA: Patent without evidence of aneurysm, dissection, vasculitis or significant stenosis.  Inflow: On the right, proximal common iliac ectatic up to 1.7 cm diameter with some scattered calcified plaque. External and internal iliac branches ectatic, mildly tortuous. No dissection or stenosis.  On the left, fusiform dilatation of distal common iliac up to 2.6 cm diameter. Ectatic tortuous internal and external iliac branches. No dissection or stenosis.  Proximal Outflow: Ectatic, mildly atheromatous, patent.  Veins: Patent hepatic  veins, portal vein, SMV, IMV, splenic vein, bilateral renal veins, IVC, and iliac venous system. No venous pathology identified.  Review of the MIP images confirms the above findings.  NON-VASCULAR  Lower chest: 1.7 cm lobulated nodule in the lateral basal segment right lower lobe, stable since 04/30/2018. No pleural or pericardial effusion.  Hepatobiliary: No liver lesion. Progressive central intrahepatic and extrahepatic biliary ductal dilatation since 04/30/2018, CBD seen down to the level of the ampulla.  Pancreas: Progressive pancreatic ductal dilatation since 04/30/2018, seen down to the level of the ampulla, without discrete pancreatic lesion.  Spleen: Normal in size without focal abnormality.  Adrenals/Urinary Tract: Bilateral adrenal hyperplasia. Probable small cysts in the upper pole right kidney and lower pole left kidney. No hydronephrosis. Urinary bladder is physiologically distended.  Stomach/Bowel: Mild gaseous distention of stomach. Distended fluid-filled proximal and mid small bowel loops, with multiple decompressed distal small bowel loops, no discrete transition point identified. No definite wall thickening. The colon is nondistended. Scattered descending and sigmoid diverticula without definite adjacent inflammatory/edematous change or abscess.  Lymphatic: No definite abdominal or pelvic adenopathy.  Reproductive: Mild prostate enlargement with central coarse calcifications.  Other: Small volume abdominal and pelvic ascites, new since 04/30/2018. No free air.  Musculoskeletal: Spondylitic changes in the lower lumbar spine. Hip DJD right greater than left. No fracture or worrisome bone lesion.  IMPRESSION: 1. Mid small bowel obstruction without evident etiology, suggesting adhesions. 2. Progressive intrahepatic and extrahepatic biliary ductal dilatation since 04/30/2018, without discrete pancreatic lesion. Correlate with any clinical  or laboratory evidence of biliary obstruction, and consider ERCP or MRCP for further evaluation if clinically indicated. 3. New small volume abdominal and pelvic ascites. 4. 5 cm fusiform infrarenal abdominal aortic aneurysm without complicating features. Recommend followup by abdomen and pelvis CTA in 3-6 months, and vascular surgery referral/consultation if not already obtained. This recommendation follows ACR consensus guidelines: White Paper of the ACR Incidental Findings Committee II on Vascular Findings. J Am Coll Radiol 2013; 10:789-794. 5. 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 6. Stable 1.7 cm right lower lobe pulmonary nodule since 04/30/2018. Recommend 1 year follow-up CT. 7. Descending and sigmoid diverticulosis.   Electronically Signed   By: Corlis Leak  Hassell M.D.   On: 01/21/2019 08:58 Assessment:   Small bowel obstruction Altered mental status, unknown etiology and duration New onset ascites noted on CT Biliary duct dilation incidentally noted on CT AAA measuring 5 cm currently asymptomatic Left common iliac artery aneurysm, 2.6 cm Stable 1.7 cm right lower lobe pulmonary nodule Sigmoid diverticulosis-asymptomatic  Plan:   We will attempt NG tube decompression and observation during involuntary commitment secondary to the altered mental status.  Unable to fully assess patient secondary to refusal, but with stable vitals and overall nonill looking appearance patient likely does not need any urgent surgical management of this obstruction at this time.  We will continue to attempt to monitor closely.  Psych consult in the meantime.  Normal LFTs despite the new onset ascites noted  on the CT scan as well as the biliary duct dilation.  Will consider MRCP once his mentation status improves.  CT review does not look like there is any amenable pockets for fluid analysis of the ascites.  These new findings were discussed with the on-call hospitalist and requested assistance for  further investigation and/or management, but he did not believe that any further investigation is needed from their service at this time.  Regarding the AAA and left common iliac artery aneurysm, will consider vascular surgery referral as an outpatient basis and/or once his mentation improves.  Asymptomatic for now so we will continue to monitor.  Sigmoid diverticulitis in right lower lobe pulmonary nodule will continue to be monitored as an outpatient basis for now.  IV fluids and pain control as needed in the meantime as well.

## 2019-01-21 NOTE — ED Notes (Signed)
Unable to obtain covid swab due to pt refusal , Charge RN aware

## 2019-01-21 NOTE — ED Provider Notes (Signed)
[facility] Emergency Department Provider Note  ____________________________________________  Time seen: Approximately 5:01 AM  I have reviewed the triage vital signs and the nursing notes.   HISTORY  Chief Complaint Emesis  Level 5 caveat:  Portions of the history and physical were unable to be obtained due to lack of coccperation   HPI Edward Mclaughlin is a 73 y.o. male with unknown past medical history who presents to the emergency department for evaluation of nausea and vomiting.  Patient is brought by [facility] under IVC after being found vomiting in the bathroom of the [facility].  According to the police officer, patient is known to [facility]. He is homeless.  When found in the bathroom vomiting, patient told the police officer that he felt like he was going to die.  After that he refused coming to the hospital and was then IVC.  Patient arrives and refuses to provide any history, he mumbles and will not answer questions. Asks staff to leave him alone and to let him sleep. Reports that he felt nauseous after eating at the [facility] and vomited a few times. Complaining of abdominal pain. He denies alcohol or drug use  PMH unknown  Allergies Patient has no allergy information on record.  No family history on file.  Social History Social History   Tobacco Use  . Smoking status: Never Smoker  . Smokeless tobacco: Never Used  Substance Use Topics  . Alcohol use: Not Currently    Frequency: Never  . Drug use: Not Currently    Review of Systems  Constitutional: Negative for fever. Eyes: Negative for visual changes. ENT: Negative for sore throat. Neck: No neck pain  Cardiovascular: Negative for chest pain. Respiratory: Negative for shortness of breath. Gastrointestinal: + abdominal pain + N/V Genitourinary: Negative for dysuria. Musculoskeletal: Negative for back pain. Skin: Negative for rash. Neurological: Negative for headaches,  weakness or numbness. Psych: No SI or HI  ____________________________________________   PHYSICAL EXAM:  VITAL SIGNS: ED Triage Vitals  Enc Vitals Group     BP 01/21/19 0230 (!) 163/93     Pulse Rate 01/21/19 0208 80     Resp 01/21/19 0208 16     Temp 01/21/19 0208 (!) 97.5 F (36.4 C)     Temp Source 01/21/19 0208 Oral     SpO2 01/21/19 0208 94 %     Weight 01/21/19 0208 180 lb (81.6 kg)     Height 01/21/19 0208 5\' 10"  (1.778 m)     Head Circumference --      Peak Flow --      Pain Score 01/21/19 0208 0     Pain Loc --      Pain Edu? --      Excl. in GC? --     Constitutional: Sleeping, easily arousable, confused. HEENT:      Head: Normocephalic and atraumatic.         Eyes: Conjunctivae are normal. Sclera is non-icteric.       Mouth/Throat: Mucous membranes are moist.       Neck: Supple with no signs of meningismus. Cardiovascular: Regular rate and rhythm. No murmurs, gallops, or rubs. 2+ symmetrical distal pulses are present in all extremities. No JVD. Respiratory: Normal respiratory effort. Lungs are clear to auscultation bilaterally. No wheezes, crackles, or rhonchi.  Gastrointestinal: Soft, diffusely tender to palpation, and non distended with positive bowel sounds. No rebound or guarding. Musculoskeletal: Nontender with normal range of motion in all extremities.  No edema, cyanosis, or erythema of extremities. Neurologic: Normal speech and language. Face is symmetric. Moving all extremities. No gross focal neurologic deficits are appreciated. Skin: Skin is warm, dry and intact. No rash noted. Psychiatric: Mood and affect are normal. Speech and behavior are normal.  ____________________________________________   LABS (all labs ordered are listed, but only abnormal results are displayed)  Labs Reviewed  CBC WITH DIFFERENTIAL/PLATELET - Abnormal; Notable for the following components:      Result Value   Hemoglobin 12.9 (*)    Lymphs Abs 0.6 (*)    All other  components within normal limits  COMPREHENSIVE METABOLIC PANEL - Abnormal; Notable for the following components:   Potassium 3.1 (*)    Glucose, Bld 145 (*)    All other components within normal limits  ETHANOL  LIPASE, BLOOD  URINE DRUG SCREEN, QUALITATIVE ([facility] ONLY)   ____________________________________________  EKG  ED ECG REPORT I, Rudene Re, the attending physician, personally viewed and interpreted this ECG.  Normal sinus rhythm, rate of 82, first-degree AV block, normal QTC, normal axis, no ST elevations or depressions.  No prior for comparison. ____________________________________________  RADIOLOGY  CT head: PND CT a/p: PND ____________________________________________   PROCEDURES  Procedure(s) performed: None Procedures Critical Care performed:  None ____________________________________________   INITIAL IMPRESSION / ASSESSMENT AND PLAN / ED COURSE   73 y.o. male with unknown past medical history who presents to the emergency department for evaluation of nausea and vomiting after eating at the [facility].    Ddx food intoxication, gastritis, alcohol intoxication, SBO, diverticulitis, GERD  Patient is confused and mumbles which per police officers is his baseline. No records of patient ever being here on on care everywhere. His abdomen is soft but diffusely tender to palpation with no distention on tenderness.  Vitals are within normal limits.  EKG no ischemic changes.  Labs showing no significant abnormalities other than mild hypokalemia and a slightly elevated sugar of 145 which is expected after eating at [facility].  Alcohol level negative. After sleeping for a few hours patient is still not answering questions, mumbling therefore will proceed with CT head and a/p.  _________________________ 7:36 AM on 01/21/2019 -----------------------------------------  CTs pending. Care transferred to Dr. Joni Fears    As part of my medical decision  making, I reviewed the following data within the [place] notes reviewed and incorporated, Labs reviewed , EKG interpreted , Radiograph reviewed , Notes from prior ED visits and [place] Controlled Substance Database   Patient was evaluated in Emergency Department today for the symptoms described in the history of present illness. Patient was evaluated in the context of the global COVID-19 pandemic, which necessitated consideration that the patient might be at risk for infection with the SARS-CoV-2 virus that causes COVID-19. Institutional protocols and algorithms that pertain to the evaluation of patients at risk for COVID-19 are in a state of rapid change based on information released by regulatory bodies including the CDC and federal and state organizations. These policies and algorithms were followed during the patient's care in the ED.   ____________________________________________   FINAL CLINICAL IMPRESSION(S) / ED DIAGNOSES   Final diagnoses:  Generalized abdominal pain  Non-intractable vomiting with nausea, unspecified vomiting type      NEW MEDICATIONS STARTED DURING THIS VISIT:  ED Discharge Orders    None       Note:  This document was prepared using Dragon voice recognition software and may include unintentional dictation errors.  Don PerkingVeronese, [place], MD 01/21/19 [phone]

## 2019-01-21 NOTE — ED Notes (Signed)
AC states to hold patient in ED at this time due to no sitter available at this time on the floor.

## 2019-01-21 NOTE — ED Notes (Signed)
Emesis noted on the floor, EVS called at this time. PT refusing to let this RN clean him up. PT removed jeans for CT. Will clean linen when pt is transferred to CT bed

## 2019-01-21 NOTE — ED Triage Notes (Addendum)
Pt to ED via EMS [facility] where pt was found throwing up in bathroom, per pd pt was stating "I am going to die". Pt arrives easily agitated, stating he just wants to rest. Pt denies pain. VSS. Pt under IVC from [place] PD, officer at bedside.

## 2019-01-21 NOTE — ED Notes (Signed)
Psych at bedside.

## 2019-01-22 DIAGNOSIS — Z0279 Encounter for issue of other medical certificate: Secondary | ICD-10-CM | POA: Diagnosis present

## 2019-01-22 DIAGNOSIS — Z0189 Encounter for other specified special examinations: Secondary | ICD-10-CM | POA: Diagnosis present

## 2019-01-22 LAB — BASIC METABOLIC PANEL
Anion gap: 13 (ref 5–15)
BUN: 17 mg/dL (ref 8–23)
CO2: 27 mmol/L (ref 22–32)
Calcium: 8.7 mg/dL — ABNORMAL LOW (ref 8.9–10.3)
Chloride: 101 mmol/L (ref 98–111)
Creatinine, Ser: 0.92 mg/dL (ref 0.61–1.24)
GFR calc Af Amer: >60 mL/min (ref >60)
GFR calc non Af Amer: >60 mL/min (ref >60)
Glucose, Bld: 122 mg/dL — ABNORMAL HIGH (ref 70–99)
Potassium: 3.1 mmol/L — ABNORMAL LOW (ref 3.5–5.1)
Sodium: 141 mmol/L (ref 135–145)

## 2019-01-22 LAB — HEPATIC FUNCTION PANEL
ALT: 18 U/L (ref 0–44)
AST: 25 U/L (ref 15–41)
Albumin: 3.8 g/dL (ref 3.5–5.0)
Alkaline Phosphatase: 57 U/L (ref 38–126)
Bilirubin, Direct: 0.4 mg/dL — ABNORMAL HIGH (ref 0.0–0.2)
Indirect Bilirubin: 1.1 mg/dL — ABNORMAL HIGH (ref 0.3–0.9)
Total Bilirubin: 1.5 mg/dL — ABNORMAL HIGH (ref 0.3–1.2)
Total Protein: 6.1 g/dL — ABNORMAL LOW (ref 6.5–8.1)

## 2019-01-22 LAB — CBC
HCT: 43.1 % (ref 39.0–52.0)
Hemoglobin: 14.3 g/dL (ref 13.0–17.0)
MCH: 30.4 pg (ref 26.0–34.0)
MCHC: 33.2 g/dL (ref 30.0–36.0)
MCV: 91.5 fL (ref 80.0–100.0)
Platelets: 213 10*3/uL (ref 150–400)
RBC: 4.71 MIL/uL (ref 4.22–5.81)
RDW: 13.5 % (ref 11.5–15.5)
WBC: 6.2 10*3/uL (ref 4.0–10.5)
nRBC: 0 % (ref 0.0–0.2)

## 2019-01-22 LAB — GLUCOSE, CAPILLARY
Glucose-Capillary: 124 mg/dL — ABNORMAL HIGH (ref 70–99)
Glucose-Capillary: 120 mg/dL — ABNORMAL HIGH (ref 70–99)

## 2019-01-22 LAB — PHOSPHORUS: Phosphorus: 3.4 mg/dL (ref 2.5–4.6)

## 2019-01-22 LAB — SARS CORONAVIRUS 2 (TAT 6-24 HRS): SARS Coronavirus 2: NEGATIVE

## 2019-01-22 LAB — MAGNESIUM: Magnesium: 1.9 mg/dL (ref 1.7–2.4)

## 2019-01-22 MED ORDER — MORPHINE SULFATE (PF) 2 MG/ML IV SOLN
2.0000 mg | INTRAVENOUS | Status: DC | PRN
  Administered 2019-01-26 – 2019-01-28 (×4): 2 mg via INTRAVENOUS
  Filled 2019-01-22 (×4): qty 1

## 2019-01-22 MED ORDER — KCL IN DEXTROSE-NACL 20-5-0.9 MEQ/L-%-% IV SOLN
INTRAVENOUS | Status: DC
  Administered 2019-01-22 – 2019-01-24 (×5): via INTRAVENOUS
  Filled 2019-01-22 (×7): qty 1000

## 2019-01-22 NOTE — Consult Note (Addendum)
[facility] Consult   Reason for Consult:  Capacity determination Referring Physician: Dr. Lysle Pearl Patient Identification: Edward Mclaughlin MRN:  [id number] Principal Diagnosis: Bowel obstruction  diagnosis:  Active Problems:   SBO (small bowel obstruction) ([place])   Total Time spent with patient: 30 minutes  Subjective:   [name] is a 73 y.o. male patient admitted with small bowel obstruction.  Consult placed for a statement that he wanted to die.  HPI on admission: Patient is a 73 year old male admitted with what appears to be a small bowel obstruction.  Patient has been refusing NG tube placement.  Psychiatry was consulted due to patient's refusal to determine if patient has capacity.  Per Dr Sheliah Mends' Response:  Patient was interviewed where he was laying in his bed with his blanket over his head.  Patient moved blanket upon writer's request.  Patient was explained the the situation as follows: Patient's bowel is currently obstructed with a backup of contents causing pain and nausea.  Patient was explained that medical team would like to decompress his bowel by placing an NG tube and additionally may have to operate if patient's condition is does not improve.  Patient was asked to explain this back to writer which she was able to do.  Patient understood that he had a problem with his stomach which was causing him pain.  Patient was then asked about his refusal of the NG tube to which his reply was "I do not want anything in my nose", patient stated that he would prefer to have the NG tube placed down his mouth instead.  Writer informed patient that he was unsure if this could be done, and further questioned patient as to why he refused the NG tube placement in his nose.  Patient states that he does not like anything coming near his nose, that his nose is very sensitive, and that he would not be able to tolerate that being in his nose for an extended period of time.   When asked if patient would refuse surgery, patient replied not if I needed it to live.  Patient states that he "of course wants to live" and he would be willing to have surgery if so necessary.  Patient understands that if he does not have the necessary surgery that he could potentially die from his condition.  Patient expresses an appreciation of this and is able to reason out that he will accept treatment if his life depended on it.  Patient states that there is no one available to contact for collateral he states that his daughter, Edward Mclaughlin, is a Marine scientist, but that he is estranged from her.  Patient states that he has a psychiatric history due to his drug use.  Patient states that he used to be on risperidone but he has not taken it for several years without symptoms.  Patient states that he is homeless because he does not have money and that if he had money he would live in the hotel.  Patient denies any psychosis at this time.  Patient denies any suicidal ideation at this time.  Patient denies any homicidal ideation at this time.    Past Psychiatric History: States that he used to have a drug problem and therefore was diagnosed schizophrenic.  He states he used to be on risperidone in the past but is no longer taking the medication for 7 years without symptoms. Risk to Self:  No Risk to Others:  No Prior Inpatient Therapy:  No Prior  Outpatient Therapy:  No  Past Medical History: History reviewed. No pertinent past medical history. History reviewed. No pertinent surgical history. Family History: No family history on file. Family Psychiatric  History: Patient denies Social History:  Social History   Substance and Sexual Activity  Alcohol Use Not Currently  . Frequency: Never     Social History   Substance and Sexual Activity  Drug Use Not Currently    Social History   Socioeconomic History  . Marital status: Widowed    Spouse name: Not on file  . Number of children: Not on  file  . Years of education: Not on file  . Highest education level: Not on file  Occupational History  . Not on file  Social Needs  . Financial resource strain: Not on file  . Food insecurity    Worry: Not on file    Inability: Not on file  . Transportation needs    Medical: Not on file    Non-medical: Not on file  Tobacco Use  . Smoking status: Never Smoker  . Smokeless tobacco: Never Used  Substance and Sexual Activity  . Alcohol use: Not Currently    Frequency: Never  . Drug use: Not Currently  . Sexual activity: Not on file  Lifestyle  . Physical activity    Days per week: Not on file    Minutes per session: Not on file  . Stress: Not on file  Relationships  . Social Musician on phone: Not on file    Gets together: Not on file    Attends religious service: Not on file    Active member of club or organization: Not on file    Attends meetings of clubs or organizations: Not on file    Relationship status: Not on file  Other Topics Concern  . Not on file  Social History Narrative  . Not on file   Additional Social History: Patient acknowledges being homeless for 10+ years states that he used to work in many capacities.    Allergies:  Not on File  Labs:  Results for orders placed or performed during the hospital encounter of 01/21/19 (from the past 48 hour(s))  Ethanol     Status: None   Collection Time: 01/21/19  3:06 AM  Result Value Ref Range   Alcohol, Ethyl (B) <10 <10 mg/dL    Comment: (NOTE) Lowest detectable limit for serum alcohol is 10 mg/dL. For medical purposes only. Performed at [facility], [address] [place], [place] [address]   CBC with Differential/Platelet     Status: Abnormal   Collection Time: 01/21/19  3:06 AM  Result Value Ref Range   WBC 7.7 4.0 - 10.5 K/uL   RBC 4.25 4.22 - 5.81 MIL/uL   Hemoglobin 12.9 (L) 13.0 - 17.0 g/dL   HCT 49.4 49.6 - 75.9 %   MCV 91.8 80.0 - 100.0 fL   MCH 30.4 26.0 - 34.0 pg    MCHC 33.1 30.0 - 36.0 g/dL   RDW 16.3 84.6 - 65.9 %   Platelets 169 150 - 400 K/uL   nRBC 0.0 0.0 - 0.2 %   Neutrophils Relative % 87 %   Neutro Abs 6.6 1.7 - 7.7 K/uL   Lymphocytes Relative 8 %   Lymphs Abs 0.6 (L) 0.7 - 4.0 K/uL   Monocytes Relative 5 %   Monocytes Absolute 0.4 0.1 - 1.0 K/uL   Eosinophils Relative 0 %   Eosinophils Absolute  0.0 0.0 - 0.5 K/uL   Basophils Relative 0 %   Basophils Absolute 0.0 0.0 - 0.1 K/uL   Immature Granulocytes 0 %   Abs Immature Granulocytes 0.03 0.00 - 0.07 K/uL    Comment: Performed at [facility], [address] [place], [place] [address]  Comprehensive metabolic panel     Status: Abnormal   Collection Time: 01/21/19  3:06 AM  Result Value Ref Range   Sodium 139 135 - 145 mmol/L   Potassium 3.1 (L) 3.5 - 5.1 mmol/L   Chloride 101 98 - 111 mmol/L   CO2 29 22 - 32 mmol/L   Glucose, Bld 145 (H) 70 - 99 mg/dL   BUN 18 8 - 23 mg/dL   Creatinine, Ser 6.040.99 0.61 - 1.24 mg/dL   Calcium 9.3 8.9 - 54.010.3 mg/dL   Total Protein 6.9 6.5 - 8.1 g/dL   Albumin 4.0 3.5 - 5.0 g/dL   AST 25 15 - 41 U/L   ALT 22 0 - 44 U/L   Alkaline Phosphatase 63 38 - 126 U/L   Total Bilirubin 1.1 0.3 - 1.2 mg/dL   GFR calc non Af Amer >60 >60 mL/min   GFR calc Af Amer >60 >60 mL/min   Anion gap 9 5 - 15    Comment: Performed at [facility], [address] [place], [place] [address]  Lipase, blood     Status: None   Collection Time: 01/21/19  3:06 AM  Result Value Ref Range   Lipase 28 11 - 51 U/L    Comment: Performed at [facility], [address] [place], [place] [address]  SARS CORONAVIRUS 2 (TAT 6-24 HRS) Nasopharyngeal Nasopharyngeal Swab     Status: None   Collection Time: 01/21/19  1:22 PM   Specimen: Nasopharyngeal Swab  Result Value Ref Range   SARS Coronavirus 2 NEGATIVE NEGATIVE    Comment: (NOTE) SARS-CoV-2 target nucleic acids are NOT DETECTED. The SARS-CoV-2 RNA is generally detectable in upper and  lower respiratory specimens during the acute phase of infection. Negative results do not preclude SARS-CoV-2 infection, do not rule out co-infections with other pathogens, and should not be used as the sole basis for treatment or other patient management decisions. Negative results must be combined with clinical observations, patient history, and epidemiological information. The expected result is Negative. Fact Sheet for Patients: HairSlick.nohttps://www.fda.gov/media/138098/download Fact Sheet for Healthcare Providers: quierodirigir.comhttps://www.fda.gov/media/138095/download This test is not yet approved or cleared by the [place] FDA and  has been authorized for detection and/or diagnosis of SARS-CoV-2 by FDA under an Emergency Use Authorization (EUA). This EUA will remain  in effect (meaning this test can be used) for the duration of the COVID-19 declaration under Section 56 4(b)(1) of the Act, 21 U.S.C. section 360bbb-3(b)(1), unless the authorization is terminated or revoked sooner. Performed at [facility], [address] [place], [place] [address]   Urine Drug Screen, Qualitative ([facility] only)     Status: Abnormal   Collection Time: 01/21/19  5:49 PM  Result Value Ref Range   Tricyclic, Ur Screen NONE DETECTED NONE DETECTED   Amphetamines, Ur Screen NONE DETECTED NONE DETECTED   MDMA (Ecstasy)Ur Screen NONE DETECTED NONE DETECTED   Cocaine Metabolite,Ur [place] NONE DETECTED NONE DETECTED   Opiate, Ur Screen POSITIVE (A) NONE DETECTED   Phencyclidine (PCP) Ur S NONE DETECTED NONE DETECTED   Cannabinoid 50 Ng, Ur [place] NONE DETECTED NONE DETECTED   Barbiturates, Ur Screen NONE DETECTED NONE  DETECTED   Benzodiazepine, Ur Scrn NONE DETECTED NONE DETECTED   Methadone Scn, Ur NONE DETECTED NONE DETECTED    Comment: (NOTE) Tricyclics + metabolites, urine    Cutoff 1000 ng/mL Amphetamines + metabolites, urine  Cutoff 1000 ng/mL MDMA (Ecstasy), urine              Cutoff 500 ng/mL Cocaine Metabolite,  urine          Cutoff 300 ng/mL Opiate + metabolites, urine        Cutoff 300 ng/mL Phencyclidine (PCP), urine         Cutoff 25 ng/mL Cannabinoid, urine                 Cutoff 50 ng/mL Barbiturates + metabolites, urine  Cutoff 200 ng/mL Benzodiazepine, urine              Cutoff 200 ng/mL Methadone, urine                   Cutoff 300 ng/mL The urine drug screen provides only a preliminary, unconfirmed analytical test result and should not be used for non-medical purposes. Clinical consideration and professional judgment should be applied to any positive drug screen result due to possible interfering substances. A more specific alternate chemical method must be used in order to obtain a confirmed analytical result. Gas chromatography / mass spectrometry (GC/MS) is the preferred confirmat ory method. Performed at [facility], [address] [place], [place] [address]   Glucose, capillary     Status: Abnormal   Collection Time: 01/21/19 11:26 PM  Result Value Ref Range   Glucose-Capillary 124 (H) 70 - 99 mg/dL  Glucose, capillary     Status: Abnormal   Collection Time: 01/22/19  4:02 AM  Result Value Ref Range   Glucose-Capillary 120 (H) 70 - 99 mg/dL  Basic metabolic panel     Status: Abnormal   Collection Time: 01/22/19  4:59 AM  Result Value Ref Range   Sodium 141 135 - 145 mmol/L   Potassium 3.1 (L) 3.5 - 5.1 mmol/L   Chloride 101 98 - 111 mmol/L   CO2 27 22 - 32 mmol/L   Glucose, Bld 122 (H) 70 - 99 mg/dL   BUN 17 8 - 23 mg/dL   Creatinine, Ser 6.04 0.61 - 1.24 mg/dL   Calcium 8.7 (L) 8.9 - 10.3 mg/dL   GFR calc non Af Amer >60 >60 mL/min   GFR calc Af Amer >60 >60 mL/min   Anion gap 13 5 - 15    Comment: Performed at [facility], [address], [place], [place] [address]  Magnesium     Status: None   Collection Time: 01/22/19  4:59 AM  Result Value Ref Range   Magnesium 1.9 1.7 - 2.4 mg/dL    Comment: Performed at [facility], [address] [place], [place] [address]  Phosphorus     Status: None   Collection Time: 01/22/19  4:59 AM  Result Value Ref Range   Phosphorus 3.4 2.5 - 4.6 mg/dL    Comment: Performed at [facility] [address] [place], [place] [address]  CBC     Status: None   Collection Time: 01/22/19  4:59 AM  Result Value Ref Range   WBC 6.2 4.0 - 10.5 K/uL   RBC 4.71 4.22 - 5.81 MIL/uL   Hemoglobin 14.3 13.0 - 17.0 g/dL   HCT 62.1 30.8 - 65.7 %   MCV 91.5  80.0 - 100.0 fL   MCH 30.4 26.0 - 34.0 pg   MCHC 33.2 30.0 - 36.0 g/dL   RDW 14.7 82.9 - 56.2 %   Platelets 213 150 - 400 K/uL   nRBC 0.0 0.0 - 0.2 %    Comment: Performed at [facility], [address] [place], [place] [address]  Hepatic function panel     Status: Abnormal   Collection Time: 01/22/19  4:59 AM  Result Value Ref Range   Total Protein 6.1 (L) 6.5 - 8.1 g/dL   Albumin 3.8 3.5 - 5.0 g/dL   AST 25 15 - 41 U/L   ALT 18 0 - 44 U/L   Alkaline Phosphatase 57 38 - 126 U/L   Total Bilirubin 1.5 (H) 0.3 - 1.2 mg/dL   Bilirubin, Direct 0.4 (H) 0.0 - 0.2 mg/dL   Indirect Bilirubin 1.1 (H) 0.3 - 0.9 mg/dL    Comment: Performed at [facility], [address], [place], [place] [address]    Current Facility-Administered Medications  Medication Dose Route Frequency Provider Last Rate Last Dose  . dextrose 5 % and 0.9 % NaCl with KCl 20 mEq/L infusion   Intravenous Continuous Sakai, Isami, DO 75 mL/hr at 01/22/19 1509    . diatrizoate meglumine-sodium (GASTROGRAFIN) 66-10 % solution 90 mL  90 mL Per NG tube Once Sakai, Isami, DO      . enoxaparin (LOVENOX) injection 40 mg  40 mg Subcutaneous Q24H Sakai, Isami, DO   40 mg at 01/22/19 0533  . ondansetron (ZOFRAN-ODT) disintegrating tablet 4 mg  4 mg Oral Q6H PRN Tonna Boehringer, Isami, DO       Or  . ondansetron (ZOFRAN) injection 4 mg  4 mg Intravenous Q6H PRN Sakai, Isami, DO   4 mg at 01/22/19 0544    Musculoskeletal: Strength & Muscle Tone: within normal  limits Gait & Station: normal Patient leans: Backward  Psychiatric Specialty Exam: Physical Exam  Nursing note and vitals reviewed. Constitutional: He is oriented to person, place, and time. He appears well-developed.  HENT:  Head: Normocephalic.  Neck: Normal range of motion.  Respiratory: Effort normal.  Musculoskeletal: Normal range of motion.  Neurological: He is alert and oriented to person, place, and time.  Psychiatric: He has a normal mood and affect. His behavior is normal. Judgment and thought content normal. His speech is slurred. Cognition and memory are normal.  Minimial to teeth    Review of Systems  Psychiatric/Behavioral: Negative for depression, substance abuse and suicidal ideas.  All other systems reviewed and are negative.   Blood pressure 136/83, pulse 81, temperature 98.9 F (37.2 C), temperature source Oral, resp. rate 18, height  (1.778 m), weight 81.6 kg, SpO2 94 %.Body mass index is 25.83 kg/m.  General Appearance: Disheveled  Eye Contact:  Fair  Speech:  Garbled  Volume:  Normal  Mood:  Euthymic  Affect:  Congruent  Thought Process:  Coherent  Orientation:  Full (Time, Place, and Person)  Thought Content:  Logical  Suicidal Thoughts:  No  Homicidal Thoughts:  No  Memory:  Negative  Judgement:  Fair  Insight:  Fair  Psychomotor Activity:  Negative  Concentration:  Concentration: Fair  Recall:  [employer] of Knowledge:  Fair  Language:  Fair  Akathisia:  No  Handed:  Right  AIMS (if indicated):     Assets:  Communication Skills Leisure Time  ADL's:  Impaired  Cognition:  WNL  Sleep:  Assessment:  Capacity can be assessed only at a moment in time for certain decision. Patient's prior history of schizophrenia does not automatically remove his capacity, as currently patient is not psychotic and is able to talk to writer reasonably.  With regard to the patient's capacity to refuse NG tube placement, the patient is able to  verbalize an appreciation of the risks and benefits of the procedure or not having the procedure.  Patient is able to reason at this time and states that he would rather try to get better without the NG tube.  Patient understands that there is a risk of vomiting and aspirating.  He is also reasonable regarding the need for surgery if it means saving his life.  At this point in time patient does have capacity to refuse NG tube placement.  Plan: Patient may change his mind about this procedure and should be asked on a daily basis.  Also attempt should be made to obtain collateral information and perhaps involve next of kin in future planning/dispo planning   Clement Sayres, MD 01/22/2019 7:22 PM

## 2019-01-22 NOTE — Plan of Care (Signed)
  Problem: Activity: Goal: Risk for activity intolerance will decrease Outcome: Not Progressing   

## 2019-01-22 NOTE — Progress Notes (Signed)
Subjective:  CC: Edward Mclaughlin is a 73 y.o. male  Hospital stay day 1,   Small bowel obstruction  HPI: No acute issues overnight reported.  Patient is more conversational this a.m. he has no specific complaints  ROS:  General: Denies weight loss, weight gain, fatigue, fevers, chills, and night sweats. Heart: Denies chest pain, palpitations, racing heart, irregular heartbeat, leg pain or swelling, and decreased activity tolerance. Respiratory: Denies breathing difficulty, shortness of breath, wheezing, cough, and sputum. GI: Denies change in appetite, heartburn, constipation, diarrhea, and blood in stool. GU: Denies difficulty urinating, pain with urinating, urgency, frequency, blood in urine.   Objective:   Temp:  [99.2 F (37.3 C)-99.5 F (37.5 C)] 99.5 F (37.5 C) (10/22 0254) Pulse Rate:  [77-91] 86 (10/22 0254) Resp:  [16-24] 24 (10/22 0254) BP: (123-167)/(89-96) 153/91 (10/22 0254) SpO2:  [92 %-100 %] 93 % (10/22 0254)     Height: 5\' 10"  (177.8 cm) Weight: 81.6 kg BMI (Calculated): 25.83   Intake/Output this shift:   Intake/Output Summary (Last 24 hours) at 01/22/2019 1201 Last data filed at 01/22/2019 0600 Gross per 24 hour  Intake -  Output 900 ml  Net -900 ml    Constitutional :  alert, cooperative, appears stated age and no distress  Respiratory:  clear to auscultation bilaterally  Cardiovascular:  regular rate and rhythm  Gastrointestinal: Soft, no guarding, but focal tenderness noted in all 4 quadrants.  Worse in the left lower quadrant.   Skin: Cool and moist.   Psychiatric: Normal affect, non-agitated, not confused       LABS:  CMP Latest Ref Rng & Units 01/22/2019 01/21/2019  Glucose 70 - 99 mg/dL 122(H) 145(H)  BUN 8 - 23 mg/dL 17 18  Creatinine 0.61 - 1.24 mg/dL 0.92 0.99  Sodium 135 - 145 mmol/L 141 139  Potassium 3.5 - 5.1 mmol/L 3.1(L) 3.1(L)  Chloride 98 - 111 mmol/L 101 101  CO2 22 - 32 mmol/L 27 29  Calcium 8.9 - 10.3 mg/dL 8.7(L) 9.3   Total Protein 6.5 - 8.1 g/dL 6.1(L) 6.9  Total Bilirubin 0.3 - 1.2 mg/dL 1.5(H) 1.1  Alkaline Phos 38 - 126 U/L 57 63  AST 15 - 41 U/L 25 25  ALT 0 - 44 U/L 18 22   CBC Latest Ref Rng & Units 01/22/2019 01/21/2019  WBC 4.0 - 10.5 K/uL 6.2 7.7  Hemoglobin 13.0 - 17.0 g/dL 14.3 12.9(L)  Hematocrit 39.0 - 52.0 % 43.1 39.0  Platelets 150 - 400 K/uL 213 169    RADS: n/a Assessment:   Patient was coherent when able to hold a conversation at this time.  We did discuss the risk benefits and alternatives to NG tube decompression including surgical exploration for his persistent small bowel obstruction.  Patient verbalized understanding and was agreeable to the NG tube placement at the time of exam.  However, when the patient's nurse attempted to place the NG tube he refused it again.  Discussed the case with the psychiatrist on consult and he will assess patient's ability to make informed decisions.  Will await evaluation prior to determining the next step.  Continue to monitor at this time.

## 2019-01-22 NOTE — Progress Notes (Signed)
RN notified MD pt still refusing NG tube. Pt has not been nauseated this morning. RN will continue to assess and monitor pt.

## 2019-01-22 NOTE — Progress Notes (Addendum)
MD notified RN pt had agree to give one more try to insert NG tube. RN Elita Quick and nursing student Danae Chen attempt to insert Ng tube and pt refuse. He started screaming that he didn't wanted in his nose. RN will continue to assess and monitor pt.

## 2019-01-23 ENCOUNTER — Inpatient Hospital Stay: Payer: Medicare Other

## 2019-01-23 LAB — BASIC METABOLIC PANEL
Anion gap: 12 (ref 5–15)
BUN: 21 mg/dL (ref 8–23)
CO2: 29 mmol/L (ref 22–32)
Calcium: 8.8 mg/dL — ABNORMAL LOW (ref 8.9–10.3)
Chloride: 101 mmol/L (ref 98–111)
Creatinine, Ser: 0.9 mg/dL (ref 0.61–1.24)
GFR calc Af Amer: >60 mL/min (ref >60)
GFR calc non Af Amer: >60 mL/min (ref >60)
Glucose, Bld: 133 mg/dL — ABNORMAL HIGH (ref 70–99)
Potassium: 3 mmol/L — ABNORMAL LOW (ref 3.5–5.1)
Sodium: 142 mmol/L (ref 135–145)

## 2019-01-23 LAB — CBC
HCT: 41.8 % (ref 39.0–52.0)
Hemoglobin: 13.8 g/dL (ref 13.0–17.0)
MCH: 30.5 pg (ref 26.0–34.0)
MCHC: 33 g/dL (ref 30.0–36.0)
MCV: 92.3 fL (ref 80.0–100.0)
Platelets: 187 10*3/uL (ref 150–400)
RBC: 4.53 MIL/uL (ref 4.22–5.81)
RDW: 13.5 % (ref 11.5–15.5)
WBC: 3.5 10*3/uL — ABNORMAL LOW (ref 4.0–10.5)
nRBC: 0 % (ref 0.0–0.2)

## 2019-01-23 LAB — HEPATIC FUNCTION PANEL
ALT: 20 U/L (ref 0–44)
AST: 26 U/L (ref 15–41)
Albumin: 3.7 g/dL (ref 3.5–5.0)
Alkaline Phosphatase: 51 U/L (ref 38–126)
Bilirubin, Direct: 0.3 mg/dL — ABNORMAL HIGH (ref 0.0–0.2)
Indirect Bilirubin: 1.2 mg/dL — ABNORMAL HIGH (ref 0.3–0.9)
Total Bilirubin: 1.5 mg/dL — ABNORMAL HIGH (ref 0.3–1.2)
Total Protein: 6.4 g/dL — ABNORMAL LOW (ref 6.5–8.1)

## 2019-01-23 LAB — PHOSPHORUS: Phosphorus: 2.4 mg/dL — ABNORMAL LOW (ref 2.5–4.6)

## 2019-01-23 LAB — MAGNESIUM: Magnesium: 2.1 mg/dL (ref 1.7–2.4)

## 2019-01-23 NOTE — TOC Initial Note (Signed)
Transition of Care [facility]) - Initial/Assessment Note    Patient Details  Name: Edward Mclaughlin MRN: [id number] Date of Birth: 04/27/1945  [facility]) CM/SW Contact:    Beverly Sessions, RN Phone Number: 01/23/2019, 3:16 PM  Clinical Narrative:                 Patient admitted for SBO Patient currently under IVC. Per psych note patient has the capacity to make medical decisions  Patient states that he is homeless.  States that he has been homeless for approximately 10 years. Patient states that he normally stays between [facility] and [place].  Patient states prior to that he stayed in and out of hotels  Patient states that he is widowed. States she draws a check from his deceased wife which is [phone].  Patient currently has $8 in his wallet. States he believes he has $600 in his [employer] account, however he currently does not have access to it because he does not have a valid ID  Patient requested to be placed at a long term care facility.  Patient aware that he would be require to turn his check over, and stay a minium of 30 days  PASRR initiated, went to level 2 and is currently under manuel review  Fl2 sent for signature  PT eval pending.  Will submit bedsearch when level of care is determined   Expected Discharge Plan: [place]     Patient Goals and CMS Choice        Expected Discharge Plan and Services Expected Discharge Plan: [place]       Living arrangements for the past 2 months: Homeless                                      Prior Living Arrangements/Services Living arrangements for the past 2 months: Homeless Lives with:: Self Patient language and need for interpreter reviewed:: Yes          Care giver support system in place?: No (comment)   Criminal Activity/Legal Involvement Pertinent to Current Situation/Hospitalization: No - Comment as needed  Activities of Daily Living Home Assistive  Devices/Equipment: None ADL Screening (condition at time of admission) Patient's cognitive ability adequate to safely complete daily activities?: Yes Is the patient deaf or have difficulty hearing?: No Does the patient have difficulty seeing, even when wearing glasses/contacts?: No Does the patient have difficulty concentrating, remembering, or making decisions?: No Patient able to express need for assistance with ADLs?: Yes Does the patient have difficulty dressing or bathing?: Yes Independently performs ADLs?: Yes (appropriate for developmental age) Does the patient have difficulty walking or climbing stairs?: Yes Weakness of Legs: Both Weakness of Arms/Hands: None  Permission Sought/Granted                  Emotional Assessment       Orientation: : Oriented to Self, Oriented to Place, Oriented to Situation   Psych Involvement: Yes (comment)  Admission diagnosis:  Small bowel obstruction ([place]) [K56.609] Generalized abdominal pain [R10.84] Encounter for imaging study to confirm nasogastric (NG) tube placement [Z01.89] Abdominal aortic aneurysm (AAA) without rupture ([place]) [I71.4] Non-intractable vomiting with nausea, unspecified vomiting type [R11.2] Patient Active Problem List   Diagnosis Date Noted  . Encounter for competency evaluation   . SBO (small bowel obstruction) ([place]) 01/21/2019   PCP:  Patient, No Pcp Per Pharmacy:  No Pharmacies Listed    Social Determinants of Health (SDOH) Interventions    Readmission Risk Interventions No flowsheet data found.

## 2019-01-23 NOTE — NC FL2 (Signed)
  [place] LEVEL OF CARE SCREENING TOOL     IDENTIFICATION  Patient Name: Edward Mclaughlin Birthdate: Feb 10, 1946 Sex: male Admission Date (Current Location): 01/21/2019  [facility] and [place] Number:  Engineering geologist and Address:         Provider Number: [id number]  Attending Physician Name and Address:  Benjamine Sprague, DO  Relative Name and Phone Number:       Current Level of Care: Hospital Recommended Level of Care: [place] Prior Approval Number:    Date Approved/Denied:   PASRR Number: Pending  Discharge Plan: SNF    Current Diagnoses: Patient Active Problem List   Diagnosis Date Noted  . Encounter for competency evaluation   . SBO (small bowel obstruction) ([place]) 01/21/2019    Orientation RESPIRATION BLADDER Height & Weight     Self, Situation, Place  Normal Continent Weight: 81.6 kg Height:  5\' 10"  (177.8 cm)  BEHAVIORAL SYMPTOMS/MOOD NEUROLOGICAL BOWEL NUTRITION STATUS      Continent Diet(NPO will advance prior to diccharge)  AMBULATORY STATUS COMMUNICATION OF NEEDS Skin   Limited Assist Verbally Normal                       Personal Care Assistance Level of Assistance  Dressing, Feeding   Feeding assistance: Independent Dressing Assistance: Independent     Functional Limitations Info  Sight Sight Info: Impaired        SPECIAL CARE FACTORS FREQUENCY                       Contractures Contractures Info: Not present    Additional Factors Info  Code Status, Allergies Code Status Info: full Allergies Info: NKDA           Current Medications (01/23/2019):  This is the current hospital active medication list Current Facility-Administered Medications  Medication Dose Route Frequency Provider Last Rate Last Dose  . dextrose 5 % and 0.9 % NaCl with KCl 20 mEq/L infusion   Intravenous Continuous Sakai, Isami, DO 75 mL/hr at 01/23/19 1000    . diatrizoate meglumine-sodium (GASTROGRAFIN) 66-10 %  solution 90 mL  90 mL Per NG tube Once Sakai, Isami, DO      . enoxaparin (LOVENOX) injection 40 mg  40 mg Subcutaneous Q24H Sakai, Isami, DO   40 mg at 01/22/19 0533  . morphine 2 MG/ML injection 2 mg  2 mg Intravenous Q3H PRN Pabon, Diego F, MD      . ondansetron (ZOFRAN-ODT) disintegrating tablet 4 mg  4 mg Oral Q6H PRN Lysle Pearl, Isami, DO       Or  . ondansetron (ZOFRAN) injection 4 mg  4 mg Intravenous Q6H PRN Sakai, Isami, DO   4 mg at 01/22/19 0544     Discharge Medications: Please see discharge summary for a list of discharge medications.  Relevant Imaging Results:  Relevant Lab Results:   Additional Information ss [id number]  Beverly Sessions, RN

## 2019-01-23 NOTE — Progress Notes (Signed)
Subjective:  CC: Edward Mclaughlin is a 73 y.o. male  Hospital stay day 2,   Small bowel obstruction  HPI: Positive BM today.  No other specific complaints  ROS:  General: Denies weight loss, weight gain, fatigue, fevers, chills, and night sweats. Heart: Denies chest pain, palpitations, racing heart, irregular heartbeat, leg pain or swelling, and decreased activity tolerance. Respiratory: Denies breathing difficulty, shortness of breath, wheezing, cough, and sputum. GI: Denies change in appetite, heartburn, constipation, diarrhea, and blood in stool. GU: Denies difficulty urinating, pain with urinating, urgency, frequency, blood in urine.   Objective:   Temp:  [98 F (36.7 C)] 98 F (36.7 C) (10/23 0428) Pulse Rate:  [88] 88 (10/23 0428) Resp:  [18] 18 (10/23 0428) BP: (162)/(90) 162/90 (10/23 0428) SpO2:  [97 %] 97 % (10/23 0428)     Height: 5\' 10"  (177.8 cm) Weight: 81.6 kg BMI (Calculated): 25.83   Intake/Output this shift:   Intake/Output Summary (Last 24 hours) at 01/23/2019 2007 Last data filed at 01/23/2019 1800 Gross per 24 hour  Intake 1786.42 ml  Output 1150 ml  Net 636.42 ml    Constitutional :  alert, cooperative, appears stated age and no distress  Respiratory:  clear to auscultation bilaterally  Cardiovascular:  regular rate and rhythm  Gastrointestinal: Soft, no guarding, focal tenderness noted in all 4 quadrants improved. still most tender in LLQ.   Skin: Cool and moist.   Psychiatric: Normal affect, non-agitated, not confused       LABS:  CMP Latest Ref Rng & Units 01/23/2019 01/22/2019 01/21/2019  Glucose 70 - 99 mg/dL 133(H) 122(H) 145(H)  BUN 8 - 23 mg/dL 21 17 18   Creatinine 0.61 - 1.24 mg/dL 0.90 0.92 0.99  Sodium 135 - 145 mmol/L 142 141 139  Potassium 3.5 - 5.1 mmol/L 3.0(L) 3.1(L) 3.1(L)  Chloride 98 - 111 mmol/L 101 101 101  CO2 22 - 32 mmol/L 29 27 29   Calcium 8.9 - 10.3 mg/dL 8.8(L) 8.7(L) 9.3  Total Protein 6.5 - 8.1 g/dL 6.4(L)  6.1(L) 6.9  Total Bilirubin 0.3 - 1.2 mg/dL 1.5(H) 1.5(H) 1.1  Alkaline Phos 38 - 126 U/L 51 57 63  AST 15 - 41 U/L 26 25 25   ALT 0 - 44 U/L 20 18 22    CBC Latest Ref Rng & Units 01/23/2019 01/22/2019 01/21/2019  WBC 4.0 - 10.5 K/uL 3.5(L) 6.2 7.7  Hemoglobin 13.0 - 17.0 g/dL 13.8 14.3 12.9(L)  Hematocrit 39.0 - 52.0 % 41.8 43.1 39.0  Platelets 150 - 400 K/uL 187 213 169    RADS: n/a Assessment:   Advance to clears.  Continue to monitor for any worsening.  Advance diet as tolerated.  Pending placement eval per SW.  PT eval pending.

## 2019-01-24 LAB — CBC
HCT: 38.3 % — ABNORMAL LOW (ref 39.0–52.0)
Hemoglobin: 12.7 g/dL — ABNORMAL LOW (ref 13.0–17.0)
MCH: 30.4 pg (ref 26.0–34.0)
MCHC: 33.2 g/dL (ref 30.0–36.0)
MCV: 91.6 fL (ref 80.0–100.0)
Platelets: 192 10*3/uL (ref 150–400)
RBC: 4.18 MIL/uL — ABNORMAL LOW (ref 4.22–5.81)
RDW: 13.6 % (ref 11.5–15.5)
WBC: 4.6 10*3/uL (ref 4.0–10.5)
nRBC: 0 % (ref 0.0–0.2)

## 2019-01-24 LAB — BASIC METABOLIC PANEL
Anion gap: 14 (ref 5–15)
BUN: 26 mg/dL — ABNORMAL HIGH (ref 8–23)
CO2: 28 mmol/L (ref 22–32)
Calcium: 8.9 mg/dL (ref 8.9–10.3)
Chloride: 99 mmol/L (ref 98–111)
Creatinine, Ser: 0.84 mg/dL (ref 0.61–1.24)
GFR calc Af Amer: >60 mL/min (ref >60)
GFR calc non Af Amer: >60 mL/min (ref >60)
Glucose, Bld: 142 mg/dL — ABNORMAL HIGH (ref 70–99)
Potassium: 2.8 mmol/L — ABNORMAL LOW (ref 3.5–5.1)
Sodium: 141 mmol/L (ref 135–145)

## 2019-01-24 LAB — PHOSPHORUS: Phosphorus: 2.8 mg/dL (ref 2.5–4.6)

## 2019-01-24 LAB — HEPATIC FUNCTION PANEL
ALT: 19 U/L (ref 0–44)
AST: 23 U/L (ref 15–41)
Albumin: 3.7 g/dL (ref 3.5–5.0)
Alkaline Phosphatase: 46 U/L (ref 38–126)
Bilirubin, Direct: 0.3 mg/dL — ABNORMAL HIGH (ref 0.0–0.2)
Indirect Bilirubin: 1 mg/dL — ABNORMAL HIGH (ref 0.3–0.9)
Total Bilirubin: 1.3 mg/dL — ABNORMAL HIGH (ref 0.3–1.2)
Total Protein: 6.4 g/dL — ABNORMAL LOW (ref 6.5–8.1)

## 2019-01-24 LAB — MAGNESIUM: Magnesium: 2.2 mg/dL (ref 1.7–2.4)

## 2019-01-24 MED ORDER — PROMETHAZINE HCL 25 MG/ML IJ SOLN
25.0000 mg | Freq: Four times a day (QID) | INTRAMUSCULAR | Status: AC | PRN
  Administered 2019-01-24 – 2019-01-26 (×3): 25 mg via INTRAVENOUS
  Filled 2019-01-24 (×3): qty 1

## 2019-01-24 NOTE — Progress Notes (Signed)
Using a few ice chips to keep mouth moist. Probably swallowing some. One incidence of coffee ground emesis, 30 ml. Also coughing and spitting constantly.

## 2019-01-24 NOTE — Progress Notes (Signed)
[place] [facility] Day(s): 3.   Post op day(s):  Marland Kitchen   Interval History: Patient seen and examined, no acute events or new complaints overnight. Patient reports not passing gas.  Patient reports that he has pain but "no pain again".  Patient sitting down on the side of the bed playing guitar and singing.  Patient does not look uncomfortable.  Patient does not look in any distress.  As per sitter and nurse patient had 1 vomit today.  Small amount.  Vital signs in last 24 hours: [min-max] current  Temp:  [97.8 F (36.6 C)-98.7 F (37.1 C)] 98.7 F (37.1 C) (10/24 0530) Pulse Rate:  [72-82] 72 (10/24 0530) Resp:  [13-15] 15 (10/24 0530) BP: (144-152)/(98-104) 144/98 (10/24 0530) SpO2:  [93 %-97 %] 93 % (10/24 0530)     Height: 5\' 10"  (177.8 cm) Weight: 81.6 kg BMI (Calculated): 25.83   Physical Exam:  Constitutional: alert, cooperative and no distress  Respiratory: breathing non-labored at rest  Cardiovascular: regular rate and sinus rhythm  Gastrointestinal: soft, non-tender, and non-distended  Labs:  CBC Latest Ref Rng & Units 01/24/2019 01/23/2019 01/22/2019  WBC 4.0 - 10.5 K/uL 4.6 3.5(L) 6.2  Hemoglobin 13.0 - 17.0 g/dL 12.7(L) 13.8 14.3  Hematocrit 39.0 - 52.0 % 38.3(L) 41.8 43.1  Platelets 150 - 400 K/uL 192 187 213   CMP Latest Ref Rng & Units 01/24/2019 01/23/2019 01/22/2019  Glucose 70 - 99 mg/dL 142(H) 133(H) 122(H)  BUN 8 - 23 mg/dL 26(H) 21 17  Creatinine 0.61 - 1.24 mg/dL 0.84 0.90 0.92  Sodium 135 - 145 mmol/L 141 142 141  Potassium 3.5 - 5.1 mmol/L 2.8(L) 3.0(L) 3.1(L)  Chloride 98 - 111 mmol/L 99 101 101  CO2 22 - 32 mmol/L 28 29 27   Calcium 8.9 - 10.3 mg/dL 8.9 8.8(L) 8.7(L)  Total Protein 6.5 - 8.1 g/dL 6.4(L) 6.4(L) 6.1(L)  Total Bilirubin 0.3 - 1.2 mg/dL 1.3(H) 1.5(H) 1.5(H)  Alkaline Phos 38 - 126 U/L 46 51 57  AST 15 - 41 U/L 23 26 25   ALT 0 - 44 U/L 19 20 18     Imaging studies: No new pertinent imaging  studies   Assessment/Plan:  73 y.o. male with small bowel obstruction, complicated by pertinent comorbidities including history of schizophrenia. Patient with imaging suggesting of small bowel obstruction.  As per nurse she had one vomiting yesterday but unable to quantify.  Today patient is comfortable without any sign of active nausea.  Patient he had a bowel movement yesterday but not has been reported since then.  Patient eating clear liquids.  He told me that he ate the Jell-O.  There has been no vomiting since the last meal.  I will not advance the diet at this moment due to the question of recurrence of the vomiting.  The abdominal exam does not look distended and is nontender.  Understand that there is any emergent indication for intubation at this moment.  We will continue with current management and follow-up with physical exam and diet toleration.  Patient again oriented that if he has recurrent vomiting in the NGT will be recommended even though he is refusing.  Arnold Long, MD

## 2019-01-24 NOTE — Progress Notes (Signed)
PT Cancellation Note  Patient Details Name: Edward Mclaughlin MRN: [id number] DOB: July 07, 1945   Cancelled Treatment:    Reason Eval/Treat Not Completed: Medical issues which prohibited therapy. Upon arrival in room pt in bed with bucket from [facility] actively vomiting multiple times. Per sitter present, he has been vomiting constantly but has also been up walking in room to use restroom. Sitter reports he needs an NG tube but pt is refusing. Nursing notified. PT will follow up when pt more medically appropriate.   Briyan Kleven PT, DPT 1:10 PM,01/24/19 [phone]

## 2019-01-25 DIAGNOSIS — F4320 Adjustment disorder, unspecified: Secondary | ICD-10-CM | POA: Diagnosis not present

## 2019-01-25 LAB — CBC
HCT: 40.2 % (ref 39.0–52.0)
Hemoglobin: 13.1 g/dL (ref 13.0–17.0)
MCH: 30 pg (ref 26.0–34.0)
MCHC: 32.6 g/dL (ref 30.0–36.0)
MCV: 92.2 fL (ref 80.0–100.0)
Platelets: 194 10*3/uL (ref 150–400)
RBC: 4.36 MIL/uL (ref 4.22–5.81)
RDW: 13.5 % (ref 11.5–15.5)
WBC: 5 10*3/uL (ref 4.0–10.5)
nRBC: 0 % (ref 0.0–0.2)

## 2019-01-25 LAB — HEPATIC FUNCTION PANEL
ALT: 18 U/L (ref 0–44)
AST: 18 U/L (ref 15–41)
Albumin: 3.6 g/dL (ref 3.5–5.0)
Alkaline Phosphatase: 43 U/L (ref 38–126)
Bilirubin, Direct: 0.2 mg/dL (ref 0.0–0.2)
Indirect Bilirubin: 1.1 mg/dL — ABNORMAL HIGH (ref 0.3–0.9)
Total Bilirubin: 1.3 mg/dL — ABNORMAL HIGH (ref 0.3–1.2)
Total Protein: 6.5 g/dL (ref 6.5–8.1)

## 2019-01-25 LAB — BASIC METABOLIC PANEL
Anion gap: 9 (ref 5–15)
BUN: 25 mg/dL — ABNORMAL HIGH (ref 8–23)
CO2: 34 mmol/L — ABNORMAL HIGH (ref 22–32)
Calcium: 8.9 mg/dL (ref 8.9–10.3)
Chloride: 99 mmol/L (ref 98–111)
Creatinine, Ser: 0.95 mg/dL (ref 0.61–1.24)
GFR calc Af Amer: >60 mL/min (ref >60)
GFR calc non Af Amer: >60 mL/min (ref >60)
Glucose, Bld: 121 mg/dL — ABNORMAL HIGH (ref 70–99)
Potassium: 2.7 mmol/L — CL (ref 3.5–5.1)
Sodium: 142 mmol/L (ref 135–145)

## 2019-01-25 LAB — MAGNESIUM: Magnesium: 2.1 mg/dL (ref 1.7–2.4)

## 2019-01-25 LAB — PHOSPHORUS: Phosphorus: 3.1 mg/dL (ref 2.5–4.6)

## 2019-01-25 MED ORDER — SODIUM CHLORIDE 0.9 % IV SOLN
INTRAVENOUS | Status: DC
  Administered 2019-01-25 – 2019-01-26 (×3): via INTRAVENOUS

## 2019-01-25 MED ORDER — POTASSIUM CHLORIDE 10 MEQ/100ML IV SOLN
10.0000 meq | INTRAVENOUS | Status: AC
  Administered 2019-01-25 (×3): 10 meq via INTRAVENOUS
  Filled 2019-01-25 (×3): qty 100

## 2019-01-25 NOTE — Progress Notes (Signed)
Telling sitter "I want to die". Settled in bed and resting with eyes closed. Had been up in chair most of night

## 2019-01-25 NOTE — Progress Notes (Signed)
Tried to explain to patient the need for an NG tube, but he said he does not want anything going in his nose. Explained the importance of the NG tube, but patient continued to refuse. Patient has difficulty focusing and discussing plan of care.  Marry Guan, RN 01/25/2019 5:21 PM

## 2019-01-25 NOTE — Progress Notes (Signed)
K 2.7 called to Dr. Windell Moment

## 2019-01-25 NOTE — Progress Notes (Signed)
PT Cancellation Note  Patient Details Name: Edward Mclaughlin MRN: [id number] DOB: September 27, 1945   Cancelled Treatment:    Reason Eval/Treat Not Completed: Other (comment).  Pt was earlier in a session to get IV placed by team, then declined despite mult efforts to talk with him about a short visit to walk.  Has been able to sit up without help to side of bed per nsg but have not been able to get him to agree to get up.  Retry at another time.   Ramond Dial 01/25/2019, 2:59 PM   Mee Hives, PT MS Acute Rehab Dept. Number: [place]

## 2019-01-25 NOTE — Consult Note (Signed)
[facility] Face-to-Face Psychiatry Consult   Reason for Consult:  Depression Referring Physician:  Dr Windell Moment Patient Identification: Edward Mclaughlin MRN:  [id number] Principal Diagnosis: Bowel obstruction Diagnosis:  Active Problems:   Acute adjustment disorder   SBO (small bowel obstruction) ([place])   Encounter for competency evaluation   Total Time spent with patient: 30 minutes  Subjective:   Edward Mclaughlin is a 73 y.o. male patient admitted with vomiting r/t bowel obstruction.  Patient seen and evaluated in person by this provider.  He was admitted to the hospital 4 days ago after being found vomiting at a [employer].  Consult was initially placed for capacity as he was refusing NG tube placement.  Dr. Claris Gower determined the patient to have capacity to make medical decisions.  I do consult was placed due to patient stating that he wanted to die.  On assessment by this provider today he is sitting on his bed playing his guitar and singing.  When asked about having any thoughts to die he said, "I want to live."  Patient denies depression, homicidal ideations, hallucinations, and recent substance abuse.  He is not in distress and psychiatrically clear at this time.  HPI: Patient admitted 4 days ago due to unretractable vomiting related to small bowel obstruction.  Capacity exam was completed by Dr. Sheliah Mends and found to have capacity to make his own medical decisions.  Past Psychiatric History: Substance abuse  Risk to Self:  none Risk to Others:  None Prior Inpatient Therapy:  None Prior Outpatient Therapy:  Substance abuse  Past Medical History: History reviewed. No pertinent past medical history. History reviewed. No pertinent surgical history. Family History: No family history on file. Family Psychiatric  History: none Social History:  Social History   Substance and Sexual Activity  Alcohol Use Not Currently  . Frequency: Never     Social History   Substance  and Sexual Activity  Drug Use Not Currently    Social History   Socioeconomic History  . Marital status: Widowed    Spouse name: Not on file  . Number of children: Not on file  . Years of education: Not on file  . Highest education level: Not on file  Occupational History  . Not on file  Social Needs  . Financial resource strain: Not on file  . Food insecurity    Worry: Not on file    Inability: Not on file  . Transportation needs    Medical: Not on file    Non-medical: Not on file  Tobacco Use  . Smoking status: Never Smoker  . Smokeless tobacco: Never Used  Substance and Sexual Activity  . Alcohol use: Not Currently    Frequency: Never  . Drug use: Not Currently  . Sexual activity: Not on file  Lifestyle  . Physical activity    Days per week: Not on file    Minutes per session: Not on file  . Stress: Not on file  Relationships  . Social Herbalist on phone: Not on file    Gets together: Not on file    Attends religious service: Not on file    Active member of club or organization: Not on file    Attends meetings of clubs or organizations: Not on file    Relationship status: Not on file  Other Topics Concern  . Not on file  Social History Narrative  . Not on file   Additional Social History:    Allergies:  Not  on File  Labs:  Results for orders placed or performed during the hospital encounter of 01/21/19 (from the past 48 hour(s))  Basic metabolic panel     Status: Abnormal   Collection Time: 01/24/19  6:13 AM  Result Value Ref Range   Sodium 141 135 - 145 mmol/L   Potassium 2.8 (L) 3.5 - 5.1 mmol/L   Chloride 99 98 - 111 mmol/L   CO2 28 22 - 32 mmol/L   Glucose, Bld 142 (H) 70 - 99 mg/dL   BUN 26 (H) 8 - 23 mg/dL   Creatinine, Ser 1.61 0.61 - 1.24 mg/dL   Calcium 8.9 8.9 - 09.6 mg/dL   GFR calc non Af Amer >60 >60 mL/min   GFR calc Af Amer >60 >60 mL/min   Anion gap 14 5 - 15    Comment: Performed at [facility], [address] [place], [place] [address]  Magnesium     Status: None   Collection Time: 01/24/19  6:13 AM  Result Value Ref Range   Magnesium 2.2 1.7 - 2.4 mg/dL    Comment: Performed at [facility], [address] [place], [place] [address]  Phosphorus     Status: None   Collection Time: 01/24/19  6:13 AM  Result Value Ref Range   Phosphorus 2.8 2.5 - 4.6 mg/dL    Comment: Performed at [facility], [address] [place], [place] [address]  CBC     Status: Abnormal   Collection Time: 01/24/19  6:13 AM  Result Value Ref Range   WBC 4.6 4.0 - 10.5 K/uL   RBC 4.18 (L) 4.22 - 5.81 MIL/uL   Hemoglobin 12.7 (L) 13.0 - 17.0 g/dL   HCT 95.6 (L) 21.3 - 08.6 %   MCV 91.6 80.0 - 100.0 fL   MCH 30.4 26.0 - 34.0 pg   MCHC 33.2 30.0 - 36.0 g/dL   RDW 57.8 46.9 - 62.9 %   Platelets 192 150 - 400 K/uL   nRBC 0.0 0.0 - 0.2 %    Comment: Performed at [facility], [address] [place], [place] [address]  Hepatic function panel     Status: Abnormal   Collection Time: 01/24/19  6:13 AM  Result Value Ref Range   Total Protein 6.4 (L) 6.5 - 8.1 g/dL   Albumin 3.7 3.5 - 5.0 g/dL   AST 23 15 - 41 U/L   ALT 19 0 - 44 U/L   Alkaline Phosphatase 46 38 - 126 U/L   Total Bilirubin 1.3 (H) 0.3 - 1.2 mg/dL   Bilirubin, Direct 0.3 (H) 0.0 - 0.2 mg/dL   Indirect Bilirubin 1.0 (H) 0.3 - 0.9 mg/dL    Comment: Performed at [facility], [address] [place], [place] [address]  Basic metabolic panel     Status: Abnormal   Collection Time: 01/25/19  5:10 AM  Result Value Ref Range   Sodium 142 135 - 145 mmol/L   Potassium 2.7 (LL) 3.5 - 5.1 mmol/L    Comment: CRITICAL RESULT CALLED TO, READ BACK BY AND VERIFIED WITH ROBIN BRIDGES AT 0631 ON 01/25/2019 JJB    Chloride 99 98 - 111 mmol/L   CO2 34 (H) 22 - 32 mmol/L   Glucose, Bld 121 (H) 70 - 99 mg/dL   BUN 25 (H) 8 - 23 mg/dL   Creatinine, Ser 1.02 0.61 - 1.24 mg/dL   Calcium 8.9 8.9 - 72.5 mg/dL   GFR calc non  Af  Amer >60 >60 mL/min   GFR calc Af Amer >60 >60 mL/min   Anion gap 9 5 - 15    Comment: Performed at [facility], [address] [place], [place] [address]  Magnesium     Status: None   Collection Time: 01/25/19  5:10 AM  Result Value Ref Range   Magnesium 2.1 1.7 - 2.4 mg/dL    Comment: Performed at [facility], [address] [place], [place] [address]  Phosphorus     Status: None   Collection Time: 01/25/19  5:10 AM  Result Value Ref Range   Phosphorus 3.1 2.5 - 4.6 mg/dL    Comment: Performed at [facility], [address] [place], [place] [address]  CBC     Status: None   Collection Time: 01/25/19  5:10 AM  Result Value Ref Range   WBC 5.0 4.0 - 10.5 K/uL   RBC 4.36 4.22 - 5.81 MIL/uL   Hemoglobin 13.1 13.0 - 17.0 g/dL   HCT 63.7 85.8 - 85.0 %   MCV 92.2 80.0 - 100.0 fL   MCH 30.0 26.0 - 34.0 pg   MCHC 32.6 30.0 - 36.0 g/dL   RDW 27.7 41.2 - 87.8 %   Platelets 194 150 - 400 K/uL   nRBC 0.0 0.0 - 0.2 %    Comment: Performed at [facility], [address] [place], [place] [address]  Hepatic function panel     Status: Abnormal   Collection Time: 01/25/19  5:10 AM  Result Value Ref Range   Total Protein 6.5 6.5 - 8.1 g/dL   Albumin 3.6 3.5 - 5.0 g/dL   AST 18 15 - 41 U/L   ALT 18 0 - 44 U/L   Alkaline Phosphatase 43 38 - 126 U/L   Total Bilirubin 1.3 (H) 0.3 - 1.2 mg/dL   Bilirubin, Direct 0.2 0.0 - 0.2 mg/dL   Indirect Bilirubin 1.1 (H) 0.3 - 0.9 mg/dL    Comment: Performed at [facility], [address] [place], [place] [address]    Current Facility-Administered Medications  Medication Dose Route Frequency Provider Last Rate Last Dose  . 0.9 %  sodium chloride infusion   Intravenous Continuous Carolan Shiver, MD 75 mL/hr at 01/25/19 [id number]    . diatrizoate meglumine-sodium (GASTROGRAFIN) 66-10 % solution 90 mL  90 mL Per NG tube Once Sakai, Isami, DO      . enoxaparin (LOVENOX) injection 40 mg  40 mg  Subcutaneous Q24H Sakai, Isami, DO   40 mg at 01/25/19 0605  . morphine 2 MG/ML injection 2 mg  2 mg Intravenous Q3H PRN Pabon, Diego F, MD      . ondansetron (ZOFRAN-ODT) disintegrating tablet 4 mg  4 mg Oral Q6H PRN Tonna Boehringer, Isami, DO       Or  . ondansetron (ZOFRAN) injection 4 mg  4 mg Intravenous Q6H PRN Sakai, Isami, DO   4 mg at 01/24/19 0508  . promethazine (PHENERGAN) injection 25 mg  25 mg Intravenous Q6H PRN Carolan Shiver, MD   25 mg at 01/24/19 1408    Musculoskeletal: Strength & Muscle Tone: decreased Gait & Station: normal Patient leans: N/A  Psychiatric Specialty Exam: Physical Exam  Nursing note and vitals reviewed. Constitutional: He is oriented to person, place, and time. He appears well-developed.  HENT:  Head: Normocephalic.  Neck: Normal range of motion.  Respiratory: Effort normal.  Musculoskeletal: Normal range of motion.  Neurological: He is alert and  oriented to person, place, and time.  Psychiatric: He has a normal mood and affect. His behavior is normal. Judgment and thought content normal. His speech is slurred. Cognition and memory are normal.  Minimal to no teeth    Review of Systems  All other systems reviewed and are negative.   Blood pressure 139/80, pulse 81, temperature 98.7 F (37.1 C), temperature source Oral, resp. rate 18, height 5\' 10"  (1.778 m), weight 81.6 kg, SpO2 (!) 89 %.Body mass index is 25.83 kg/m.  General Appearance: Casual  Eye Contact:  Good  Speech:  Slurred, minimal teeth  Volume:  Normal  Mood:  Euthymic  Affect:  Congruent  Thought Process:  Coherent and Descriptions of Associations: Intact  Orientation:  Full (Time, Place, and Person)  Thought Content:  Logical  Suicidal Thoughts:  No  Homicidal Thoughts:  No  Memory:  Immediate;   Good Recent;   Good Remote;   Good  Judgement:  Fair  Insight:  Fair  Psychomotor Activity:  Decreased  Concentration:  Concentration: Fair and Attention Span: Fair  Recall:   FiservFair  Fund of Knowledge:  Fair  Language:  Fair  Akathisia:  No  Handed:  Right  AIMS (if indicated):     Assets:  Leisure Time Resilience  ADL's:  Intact  Cognition:  WNL  Sleep:      73 year old male admitted for vomiting due to a small bowel obstruction.  Initial consult placed for capacity to make his medical decisions independently, Dr. Brien Fewristafano establish that he does have capacity.  Today a consult was placed with concerns that he wanted to die.  These concerns were unfounded on assessment.  Edward Mclaughlin was playing his guitar sitting on his bed and singing.  Denies suicidal thoughts and desire to die.  Denies depression and other concerns at this time.  Psychiatrically cleared.  Treatment Plan Summary: Acute adjustment disorder: -Follow-up with [facility] after discharge, if needed -Found to have medical capacity by Dr. Brien Fewristafano on 1022  Disposition: No evidence of imminent risk to self or others at present.    Nanine MeansJamison Lord, NP 01/25/2019 2:30 PM

## 2019-01-25 NOTE — Progress Notes (Signed)
[place] [facility] Day(s): 4.   Post op day(s):  Marland Kitchen   Interval History: Patient seen and examined, no acute events or new complaints overnight. Patient reports having nausea.  Denies significant pain.  Patient has been very inconsistent with his history.  And sometimes he does not seems oriented and very tangential on the question asked and other times he sounds reasonable.  Last night he was expressing that he does not want to to leave but during the day he is seen getting plantar.  As per nurse he has been having nausea and vomiting.  Unable to assess pain radiation because patient did not answer the question.  Unable to assess alleviating or aggravating factors.  Vital signs in last 24 hours: [min-max] current  Temp:  [98.3 F (36.8 C)-98.9 F (37.2 C)] 98.3 F (36.8 C) (10/25 1909) Pulse Rate:  [71-81] 80 (10/25 1909) Resp:  [18] 18 (10/25 1909) BP: (114-139)/(74-82) 114/74 (10/25 1909) SpO2:  [89 %-92 %] 90 % (10/25 1909) FiO2 (%):  [2 %] 2 % (10/25 1322)     Height: 5\' 10"  (177.8 cm) Weight: 81.6 kg BMI (Calculated): 25.83    Physical Exam:  Constitutional: alert, no distress  Respiratory: breathing non-labored at rest  Cardiovascular: regular rate and sinus rhythm  Gastrointestinal: soft, non-tender, and distended  Labs:  CBC Latest Ref Rng & Units 01/25/2019 01/24/2019 01/23/2019  WBC 4.0 - 10.5 K/uL 5.0 4.6 3.5(L)  Hemoglobin 13.0 - 17.0 g/dL 13.1 12.7(L) 13.8  Hematocrit 39.0 - 52.0 % 40.2 38.3(L) 41.8  Platelets 150 - 400 K/uL 194 192 187   CMP Latest Ref Rng & Units 01/25/2019 01/24/2019 01/23/2019  Glucose 70 - 99 mg/dL 121(H) 142(H) 133(H)  BUN 8 - 23 mg/dL 25(H) 26(H) 21  Creatinine 0.61 - 1.24 mg/dL 0.95 0.84 0.90  Sodium 135 - 145 mmol/L 142 141 142  Potassium 3.5 - 5.1 mmol/L 2.7(LL) 2.8(L) 3.0(L)  Chloride 98 - 111 mmol/L 99 99 101  CO2 22 - 32 mmol/L 34(H) 28 29  Calcium 8.9 - 10.3 mg/dL 8.9 8.9 8.8(L)  Total Protein 6.5 - 8.1 g/dL 6.5  6.4(L) 6.4(L)  Total Bilirubin 0.3 - 1.2 mg/dL 1.3(H) 1.3(H) 1.5(H)  Alkaline Phos 38 - 126 U/L 43 46 51  AST 15 - 41 U/L 18 23 26   ALT 0 - 44 U/L 18 19 20     Imaging studies: No new pertinent imaging studies   Assessment/Plan:  73 y.o. male with small bowel obstruction, complicated by pertinent comorbidities including history of schizophrenia. Patient very difficult to assess.  Very inconsistent with his history and symptoms.  He has been vomiting more frequently.  He is abdomen is also more distended today.  I discussed with him the alternative fully and NGT but he again refused and answered question that has nothing to do with his nausea or if he accepts the tube placement.  He also expressed not wanting to live during the night.  I asked psych for evaluation for possible depressive thoughts but at the moment of their evaluation he was singing and playing the guitar.  Patient is basically denying every treatment that has been offered.  I did mention the possible need of surgery but his answer was all related to my question.  I will keep patient n.p.o. we will continue with IV hydration.  There is no leukocytosis there is no acidosis.  Potassium was replaced.  Will follow with physical exam and continue to try talking to the  patient about treatment alternatives.  As per psychiatry he is capable of making decisions.  We will continue with DVT prophylaxis.  Encourage the patient to ambulate.  Gae Gallop, MD

## 2019-01-26 LAB — CBC
HCT: 41.3 % (ref 39.0–52.0)
Hemoglobin: 13.6 g/dL (ref 13.0–17.0)
MCH: 30.2 pg (ref 26.0–34.0)
MCHC: 32.9 g/dL (ref 30.0–36.0)
MCV: 91.6 fL (ref 80.0–100.0)
Platelets: 185 10*3/uL (ref 150–400)
RBC: 4.51 MIL/uL (ref 4.22–5.81)
RDW: 13.6 % (ref 11.5–15.5)
WBC: 7.3 10*3/uL (ref 4.0–10.5)
nRBC: 0 % (ref 0.0–0.2)

## 2019-01-26 LAB — BASIC METABOLIC PANEL
Anion gap: 16 — ABNORMAL HIGH (ref 5–15)
BUN: 37 mg/dL — ABNORMAL HIGH (ref 8–23)
CO2: 28 mmol/L (ref 22–32)
Calcium: 8.9 mg/dL (ref 8.9–10.3)
Chloride: 100 mmol/L (ref 98–111)
Creatinine, Ser: 1 mg/dL (ref 0.61–1.24)
GFR calc Af Amer: >60 mL/min (ref >60)
GFR calc non Af Amer: >60 mL/min (ref >60)
Glucose, Bld: 96 mg/dL (ref 70–99)
Potassium: 2.8 mmol/L — ABNORMAL LOW (ref 3.5–5.1)
Sodium: 144 mmol/L (ref 135–145)

## 2019-01-26 LAB — HEPATIC FUNCTION PANEL
ALT: 15 U/L (ref 0–44)
AST: 14 U/L — ABNORMAL LOW (ref 15–41)
Albumin: 3.6 g/dL (ref 3.5–5.0)
Alkaline Phosphatase: 50 U/L (ref 38–126)
Bilirubin, Direct: 0.3 mg/dL — ABNORMAL HIGH (ref 0.0–0.2)
Indirect Bilirubin: 1.4 mg/dL — ABNORMAL HIGH (ref 0.3–0.9)
Total Bilirubin: 1.7 mg/dL — ABNORMAL HIGH (ref 0.3–1.2)
Total Protein: 6.5 g/dL (ref 6.5–8.1)

## 2019-01-26 LAB — MAGNESIUM: Magnesium: 2.2 mg/dL (ref 1.7–2.4)

## 2019-01-26 LAB — PHOSPHORUS: Phosphorus: 3.7 mg/dL (ref 2.5–4.6)

## 2019-01-26 MED ORDER — POTASSIUM CHLORIDE 10 MEQ/100ML IV SOLN
10.0000 meq | INTRAVENOUS | Status: AC
  Administered 2019-01-26 (×2): 10 meq via INTRAVENOUS
  Filled 2019-01-26 (×2): qty 100

## 2019-01-26 MED ORDER — KCL IN DEXTROSE-NACL 40-5-0.45 MEQ/L-%-% IV SOLN
INTRAVENOUS | Status: DC
  Administered 2019-01-26 – 2019-01-29 (×6): via INTRAVENOUS
  Filled 2019-01-26 (×10): qty 1000

## 2019-01-26 MED ORDER — POTASSIUM CHLORIDE 10 MEQ/100ML IV SOLN
10.0000 meq | INTRAVENOUS | Status: AC
  Administered 2019-01-26: 10 meq via INTRAVENOUS
  Filled 2019-01-26: qty 100

## 2019-01-26 MED ORDER — FAMOTIDINE 20 MG PO TABS
20.0000 mg | ORAL_TABLET | Freq: Every day | ORAL | Status: DC
  Administered 2019-01-26 – 2019-01-28 (×2): 20 mg via ORAL
  Filled 2019-01-26 (×2): qty 1

## 2019-01-26 MED ORDER — POTASSIUM CHLORIDE CRYS ER 20 MEQ PO TBCR
40.0000 meq | EXTENDED_RELEASE_TABLET | ORAL | Status: DC
  Filled 2019-01-26: qty 2

## 2019-01-26 NOTE — BH Assessment (Signed)
[facility]  PAPERS  RESCINDED BY [facility]

## 2019-01-26 NOTE — Progress Notes (Signed)
Subjective:  CC: Edward Mclaughlin is a 73 y.o. male  Hospital stay day 5,   Small bowel obstruction  HPI: No specific complaints.  No reported flatus or BM.  ROS:  General: Denies weight loss, weight gain, fatigue, fevers, chills, and night sweats. Heart: Denies chest pain, palpitations, racing heart, irregular heartbeat, leg pain or swelling, and decreased activity tolerance. Respiratory: Denies breathing difficulty, shortness of breath, wheezing, cough, and sputum. GI: Denies change in appetite, heartburn, constipation, diarrhea, and blood in stool. GU: Denies difficulty urinating, pain with urinating, urgency, frequency, blood in urine.   Objective:   Temp:  [98.3 F (36.8 C)-99.6 F (37.6 C)] 99.6 F (37.6 C) (10/26 0702) Pulse Rate:  [77-80] 77 (10/26 0702) Resp:  [18] 18 (10/25 1909) BP: (114-127)/(74-85) 127/85 (10/26 0702) SpO2:  [76 %-90 %] 76 % (10/26 0702)     Height: 5\' 10"  (177.8 cm) Weight: 81.6 kg BMI (Calculated): 25.83   Intake/Output this shift:   Intake/Output Summary (Last 24 hours) at 01/26/2019 1525 Last data filed at 01/26/2019 1518 Gross per 24 hour  Intake 1006.47 ml  Output 450 ml  Net 556.47 ml    Constitutional :  alert, cooperative, appears stated age and no distress  Respiratory:  clear to auscultation bilaterally  Cardiovascular:  regular rate and rhythm  Gastrointestinal: Soft, no guarding, focal tenderness noted in all 4 quadrants again,still most tender in LLQ, mild distention.   Skin: Cool and moist.   Psychiatric: Normal affect, non-agitated, not confused       LABS:  CMP Latest Ref Rng & Units 01/26/2019 01/25/2019 01/24/2019  Glucose 70 - 99 mg/dL 96 121(H) 142(H)  BUN 8 - 23 mg/dL 37(H) 25(H) 26(H)  Creatinine 0.61 - 1.24 mg/dL 1.00 0.95 0.84  Sodium 135 - 145 mmol/L 144 142 141  Potassium 3.5 - 5.1 mmol/L 2.8(L) 2.7(LL) 2.8(L)  Chloride 98 - 111 mmol/L 100 99 99  CO2 22 - 32 mmol/L 28 34(H) 28  Calcium 8.9 - 10.3 mg/dL 8.9  8.9 8.9  Total Protein 6.5 - 8.1 g/dL 6.5 6.5 6.4(L)  Total Bilirubin 0.3 - 1.2 mg/dL 1.7(H) 1.3(H) 1.3(H)  Alkaline Phos 38 - 126 U/L 50 43 46  AST 15 - 41 U/L 14(L) 18 23  ALT 0 - 44 U/L 15 18 19    CBC Latest Ref Rng & Units 01/26/2019 01/25/2019 01/24/2019  WBC 4.0 - 10.5 K/uL 7.3 5.0 4.6  Hemoglobin 13.0 - 17.0 g/dL 13.6 13.1 12.7(L)  Hematocrit 39.0 - 52.0 % 41.3 40.2 38.3(L)  Platelets 150 - 400 K/uL 185 194 192    RADS: n/a Assessment:   SBO, persistent, failed p.o. trial over the weekend.  Now remains consistently concerning for small bowel obstruction.  Had a extensive discussion with the patient again regarding treatment options including NG tube and exploratory surgery.  Patient again is still adamant about refusing NG tube but is now amenable to surgical intervention.  I explained to him specifically the risk benefits and alternatives as noted below.  He verbalized understanding and still wishes to proceed.  At the time of this conversation he was most lucent and not tangential since his admission in my opinion.  Therefore, we have agreed to proceed with surgical exploration.  Currently the patient remains nonacidotic, with minimal discomfort, and no active nausea or vomiting, not considered an urgent procedure.  Due to lack of OR availability we will proceed with surgery tomorrow.  Discussed pathophisiology and treatment options including NG tube decompression and  possible surgery for lysis of adhesions, laparoscopic or open.  The risk of surgery include, but not limited to, recurrence, bleeding, chronic pain, post-op infxn, post-op SBO or ileus, hernias, resection of bowel, re-anastamosis, possible ostomy placement and need for re-operation to address said risks. The risks of general anesthetic, if used, includes MI, CVA, sudden death or even reaction to anesthetic medications also discussed. Alternatives include continued observation and NG decompression.  Benefits include possible  symptom relief, preventing further decline in health and possible death.  Typical post-op recovery time of additional days in hospital for observation afterwards also discussed.  The patient verbalized understanding and all questions were answered to the patient's satisfaction.

## 2019-01-26 NOTE — TOC Progression Note (Signed)
Transition of Care [facility]) - Progression Note    Patient Details  Name: Edward Mclaughlin MRN: [id number] Date of Birth: 30-Nov-1945  Transition of Care [facility] CM/SW Contact  Beverly Sessions, RN Phone Number: 01/26/2019, 4:07 PM  Clinical Narrative:    PT has assessed patient and recommends SNF.  Patient still in agreement for placement. Bedsearch initiated  Additional information sent to [employer] is currently under manuel review    Expected Discharge Plan: [place]    Expected Discharge Plan and Services Expected Discharge Plan: [place] arrangements for the past 2 months: Homeless                                       Social Determinants of Health (SDOH) Interventions    Readmission Risk Interventions No flowsheet data found.

## 2019-01-26 NOTE — Care Management Important Message (Signed)
Important Message  Patient Details  Name: Edward Mclaughlin MRN: [id number] Date of Birth: 1945/07/16   Medicare Important Message Given:  Yes     Dannette Barbara 01/26/2019, 10:27 AM

## 2019-01-26 NOTE — Evaluation (Signed)
Physical Therapy Evaluation Patient Details Name: Edward Mclaughlin MRN: [id number] DOB: [birth year] Today's Date: 01/26/2019   History of Present Illness  presented to ER after being found wandering in parking lot with repeated emesis; admitted for management of AMS, SBO.  Attempted decompression via NGT, but patient consistently refusing NGT.  Surgery remains option, but attempting conservative mangaement.  Clinical Impression  Upon evaluation, patient alert and oriented to self; sitting edge of bed with guitar upon arrival to session.  Generally disoriented, but follows commands (optimized with therapist demonstration) with frequent reorientation to task.  Bilat UE/LE strength grossly 4-/5 throughout; generally clumsy and discoordinated (but symmetrical).  Currently requiring min assist for sit/stand, standing balance and short-distance gait (5') with RW.  Demonstrates broad BOS with choppy stepping pattern, inconsistent foot placement and stepping pattern; abdominal flaring with incresaed postural sway in A/P plane.  Poor walker position and management, poor balance, poor insight/safety; very high fall risk. Would benefit from skilled PT to address above deficits and promote optimal return to PLOF.; recommend transition to STR upon discharge from acute hospitalization.     Follow Up Recommendations SNF    Equipment Recommendations       Recommendations for Other Services       Precautions / Restrictions Precautions Precautions: Fall Precaution Comments: NPO Restrictions Weight Bearing Restrictions: No      Mobility  Bed Mobility Overal bed mobility: Modified Independent                Transfers Overall transfer level: Needs assistance Equipment used: Rolling walker (2 wheeled) Transfers: Sit to/from Stand Sit to Stand: Min assist         General transfer comment: pulls on RW despite cuing; maintains broad BOS, assist for lift off and balance in A/P  plane  Ambulation/Gait Ambulation/Gait assistance: Min assist Gait Distance (Feet): 10 Feet Assistive device: Rolling walker (2 wheeled)       General Gait Details: broad BOS with choppy stepping pattern, inconsistent foot placement and stepping pattern; abdominal flaring with incresaed postural sway in A/P plane.  Poor walker position and management, poor balance, poor insight/safety; very high fall risk. Patient refused additional distance due to fatigue and "feeling weak"; returned to bed spontaneously.  Stairs            Wheelchair Mobility    Modified Rankin (Stroke Patients Only)       Balance Overall balance assessment: Needs assistance Sitting-balance support: No upper extremity supported;Feet supported Sitting balance-Leahy Scale: Good     Standing balance support: Bilateral upper extremity supported Standing balance-Leahy Scale: Poor                               Pertinent Vitals/Pain Pain Assessment: Faces Faces Pain Scale: No hurt    Home Living Family/patient expects to be discharged to:: Shelter/Homeless     Type of Home: Homeless                Prior Function Level of Independence: Independent         Comments: Patient poor historian, questionable report; however, suggests indep without assist device     Hand Dominance        Extremity/Trunk Assessment   Upper Extremity Assessment Upper Extremity Assessment: Generalized weakness    Lower Extremity Assessment Lower Extremity Assessment: Generalized weakness(grossly 4-/5 throughout; generally discoordinated and clumsy)       Communication   Communication: (speech intermittently slurred; significant  difficulty maintaining topic of conversation)  Cognition Arousal/Alertness: Awake/alert Behavior During Therapy: Impulsive Overall Cognitive Status: No family/caregiver present to determine baseline cognitive functioning                                  General Comments: oriented to self only; follows commands, optimized with demonstration from therapist      General Comments      Exercises     Assessment/Plan    PT Assessment Patient needs continued PT services  PT Problem List Decreased strength;Decreased range of motion;Decreased activity tolerance;Decreased balance;Decreased mobility;Decreased coordination;Decreased cognition;Decreased knowledge of use of DME;Decreased safety awareness;Decreased knowledge of precautions       PT Treatment Interventions DME instruction;Gait training;Stair training;Functional mobility training;Therapeutic activities;Therapeutic exercise;Balance training;Cognitive remediation;Patient/family education    PT Goals (Current goals can be found in the Care Plan section)  Acute Rehab PT Goals Patient Stated Goal: agreeable to gait efforts PT Goal Formulation: With patient Time For Goal Achievement: 02/09/19 Potential to Achieve Goals: Fair    Frequency Min 2X/week   Barriers to discharge        Co-evaluation               AM-PAC PT "6 Clicks" Mobility  Outcome Measure Help needed turning from your back to your side while in a flat bed without using bedrails?: None Help needed moving from lying on your back to sitting on the side of a flat bed without using bedrails?: None Help needed moving to and from a bed to a chair (including a wheelchair)?: A Little Help needed standing up from a chair using your arms (e.g., wheelchair or bedside chair)?: A Little Help needed to walk in hospital room?: A Little Help needed climbing 3-5 steps with a railing? : A Little 6 Click Score: 20    End of Session Equipment Utilized During Treatment: Gait belt Activity Tolerance: Patient limited by fatigue Patient left: in bed;with call bell/phone within reach;with nursing/sitter in room Nurse Communication: Mobility status PT Visit Diagnosis: Muscle weakness (generalized) (M62.81);Difficulty in walking,  not elsewhere classified (R26.2)    Time: 3220-2542 PT Time Calculation (min) (ACUTE ONLY): 17 min   Charges:   PT Evaluation $PT Eval Moderate Complexity: 1 Mod          Prince Olivier H. Manson Passey, PT, DPT, NCS 01/26/19, 3:15 PM [phone]

## 2019-01-26 NOTE — Progress Notes (Signed)
Initial Nutrition Assessment  DOCUMENTATION CODES:   Not applicable  INTERVENTION:   RD will monitor for diet advancement vs the need for nutrition support  Recommend TPN if unable to advance diet in the next 1-2 days  Pt likely at high refeed risk  NUTRITION DIAGNOSIS:   Inadequate oral intake related to acute illness(SBO) as evidenced by NPO status.  GOAL:   Patient will meet greater than or equal to 90% of their needs  MONITOR:   Diet advancement, Labs, Weight trends, Skin, I & O's  REASON FOR ASSESSMENT:   NPO/Clear Liquid Diet    ASSESSMENT:   73 y.o. male with small bowel obstruction, complicated by pertinent comorbidities including history of schizophrenia.  Did not see patient today as pt is agitated and refusing treatments. Per chart review, pt admitted with nausea, vomiting and abdominal pain. Pt is also noted to be homeless. RD suspects pt with poor appetite and oral intake at baseline. Pt refusing NGT tube and surgical interventions. RD will monitor for diet advancement vs the need for nutrition support. Pt has been on NPO/clear liquid diet since admit and is now without adequate nutrition for > 6 days. Would recommend TPN if unable to advance diet in the next 1-2 days. Pt is at high refeed risk.   There is no weight history in chart to determine if any significant weight changes.   Medications reviewed and include: Lovenox, NaCl @75ml /hr, morphine, zofran, phenergan   Labs reviewed: K 2.8(L), P 3.7 wnl, Mg 2.2 wnl, tbili 1.7(H)  Unable to complete Nutrition-Focused physical exam at this time.   Diet Order:   Diet Order            Diet NPO time specified  Diet effective now             EDUCATION NEEDS:   Not appropriate for education at this time  Skin:  Skin Assessment: Reviewed RN Assessment(wound toe)  Last BM:  10/23  Height:   Ht Readings from Last 1 Encounters:  01/21/19 5\' 10"  (1.778 m)    Weight:   Wt Readings from Last 1  Encounters:  01/21/19 81.6 kg    Ideal Body Weight:  75.4 kg  BMI:  Body mass index is 25.83 kg/m.  Estimated Nutritional Needs:   Kcal:  1800-2100kcal/day  Protein:  90-105g/day  Fluid:  >1.9L/day  Koleen Distance MS, RD, LDN Pager #- [phone] Office#- [phone] After Hours Pager: [phone]

## 2019-01-27 ENCOUNTER — Encounter
Admission: EM | Disposition: A | Discharge status: Skilled Nursing Facility | Payer: Self-pay | Source: Home / Self Care | Attending: Surgery

## 2019-01-27 ENCOUNTER — Inpatient Hospital Stay: Payer: Medicare Other | Admitting: Anesthesiology

## 2019-01-27 ENCOUNTER — Encounter: Payer: Self-pay | Admitting: *Deleted

## 2019-01-27 HISTORY — PX: LAPAROTOMY: SHX154

## 2019-01-27 HISTORY — PX: LYSIS OF ADHESION: SHX5961

## 2019-01-27 LAB — PHOSPHORUS: Phosphorus: 3 mg/dL (ref 2.5–4.6)

## 2019-01-27 LAB — HEPATIC FUNCTION PANEL
ALT: 14 U/L (ref 0–44)
AST: 15 U/L (ref 15–41)
Albumin: 3.5 g/dL (ref 3.5–5.0)
Alkaline Phosphatase: 49 U/L (ref 38–126)
Bilirubin, Direct: 0.3 mg/dL — ABNORMAL HIGH (ref 0.0–0.2)
Indirect Bilirubin: 0.9 mg/dL (ref 0.3–0.9)
Total Bilirubin: 1.2 mg/dL (ref 0.3–1.2)
Total Protein: 6.8 g/dL (ref 6.5–8.1)

## 2019-01-27 LAB — POTASSIUM: Potassium: 3.3 mmol/L — ABNORMAL LOW (ref 3.5–5.1)

## 2019-01-27 LAB — MAGNESIUM: Magnesium: 2.4 mg/dL (ref 1.7–2.4)

## 2019-01-27 SURGERY — LAPAROTOMY, EXPLORATORY
Anesthesia: General

## 2019-01-27 MED ORDER — PROPOFOL 10 MG/ML IV BOLUS
INTRAVENOUS | Status: AC
  Filled 2019-01-27: qty 20

## 2019-01-27 MED ORDER — FENTANYL CITRATE (PF) 100 MCG/2ML IJ SOLN: 25.0000 ug | INTRAMUSCULAR | Status: DC | PRN

## 2019-01-27 MED ORDER — FENTANYL CITRATE (PF) 250 MCG/5ML IJ SOLN
INTRAMUSCULAR | Status: AC
  Filled 2019-01-27: qty 5

## 2019-01-27 MED ORDER — LACTATED RINGERS IV SOLN
INTRAVENOUS | Status: DC | PRN
  Administered 2019-01-27: 13:00:00 via INTRAVENOUS

## 2019-01-27 MED ORDER — BUPIVACAINE LIPOSOME 1.3 % IJ SUSP
INTRAMUSCULAR | Status: DC | PRN
  Administered 2019-01-27: 20 mL

## 2019-01-27 MED ORDER — PHENYLEPHRINE HCL-NACL 20-0.9 MG/250ML-% IV SOLN
INTRAVENOUS | Status: DC | PRN
  Administered 2019-01-27: 40 ug/min via INTRAVENOUS

## 2019-01-27 MED ORDER — SODIUM CHLORIDE 0.9 % IV BOLUS: 500.0000 mL | Freq: Once | INTRAVENOUS | Status: DC

## 2019-01-27 MED ORDER — LIDOCAINE HCL (CARDIAC) PF 100 MG/5ML IV SOSY
PREFILLED_SYRINGE | INTRAVENOUS | Status: DC | PRN
  Administered 2019-01-27: 60 mg via INTRAVENOUS

## 2019-01-27 MED ORDER — ONDANSETRON HCL 4 MG/2ML IJ SOLN
INTRAMUSCULAR | Status: DC | PRN
  Administered 2019-01-27: 4 mg via INTRAVENOUS

## 2019-01-27 MED ORDER — FENTANYL CITRATE (PF) 100 MCG/2ML IJ SOLN
INTRAMUSCULAR | Status: DC | PRN
  Administered 2019-01-27 (×4): 25 ug via INTRAVENOUS
  Administered 2019-01-27: 50 ug via INTRAVENOUS
  Administered 2019-01-27: 25 ug via INTRAVENOUS

## 2019-01-27 MED ORDER — PIPERACILLIN-TAZOBACTAM 3.375 G IVPB
3.3750 g | Freq: Three times a day (TID) | INTRAVENOUS | Status: DC
  Administered 2019-01-27 – 2019-02-01 (×14): 3.375 g via INTRAVENOUS
  Filled 2019-01-27 (×14): qty 50

## 2019-01-27 MED ORDER — BUPIVACAINE LIPOSOME 1.3 % IJ SUSP
INTRAMUSCULAR | Status: AC
  Filled 2019-01-27: qty 20

## 2019-01-27 MED ORDER — LIDOCAINE HCL (PF) 2 % IJ SOLN
INTRAMUSCULAR | Status: AC
  Filled 2019-01-27: qty 10

## 2019-01-27 MED ORDER — ONDANSETRON HCL 4 MG/2ML IJ SOLN: 4.0000 mg | Freq: Once | INTRAMUSCULAR | Status: DC | PRN

## 2019-01-27 MED ORDER — BUPIVACAINE HCL (PF) 0.5 % IJ SOLN
INTRAMUSCULAR | Status: AC
  Filled 2019-01-27: qty 30

## 2019-01-27 MED ORDER — DEXAMETHASONE SODIUM PHOSPHATE 10 MG/ML IJ SOLN
INTRAMUSCULAR | Status: DC | PRN
  Administered 2019-01-27: 10 mg via INTRAVENOUS

## 2019-01-27 MED ORDER — SODIUM CHLORIDE (PF) 0.9 % IJ SOLN
INTRAMUSCULAR | Status: AC
  Filled 2019-01-27: qty 50

## 2019-01-27 MED ORDER — PANTOPRAZOLE SODIUM 40 MG IV SOLR
40.0000 mg | Freq: Every day | INTRAVENOUS | Status: DC
  Administered 2019-01-27 – 2019-01-31 (×5): 40 mg via INTRAVENOUS
  Filled 2019-01-27 (×5): qty 40

## 2019-01-27 MED ORDER — EPINEPHRINE PF 1 MG/ML IJ SOLN
INTRAMUSCULAR | Status: AC
  Filled 2019-01-27: qty 1

## 2019-01-27 MED ORDER — SUGAMMADEX SODIUM 200 MG/2ML IV SOLN
INTRAVENOUS | Status: DC | PRN
  Administered 2019-01-27: 164 mg via INTRAVENOUS

## 2019-01-27 MED ORDER — EPHEDRINE SULFATE 50 MG/ML IJ SOLN
INTRAMUSCULAR | Status: DC | PRN
  Administered 2019-01-27: 10 mg via INTRAVENOUS

## 2019-01-27 MED ORDER — SODIUM CHLORIDE (PF) 0.9 % IJ SOLN
INTRAMUSCULAR | Status: DC | PRN
  Administered 2019-01-27: 50 mL

## 2019-01-27 MED ORDER — PROPOFOL 10 MG/ML IV BOLUS
INTRAVENOUS | Status: DC | PRN
  Administered 2019-01-27: 80 mg via INTRAVENOUS

## 2019-01-27 MED ORDER — KETOROLAC TROMETHAMINE 15 MG/ML IJ SOLN: 15.0000 mg | Freq: Four times a day (QID) | INTRAMUSCULAR | Status: DC | PRN

## 2019-01-27 MED ORDER — PHENYLEPHRINE HCL (PRESSORS) 10 MG/ML IV SOLN
INTRAVENOUS | Status: DC | PRN
  Administered 2019-01-27 (×6): 100 ug via INTRAVENOUS

## 2019-01-27 MED ORDER — LACTATED RINGERS BOLUS PEDS
1000.0000 mL | Freq: Once | INTRAVENOUS | Status: AC
  Administered 2019-01-27: 1000 mL via INTRAVENOUS

## 2019-01-27 MED ORDER — ENOXAPARIN SODIUM 40 MG/0.4ML ~~LOC~~ SOLN
40.0000 mg | SUBCUTANEOUS | Status: DC
  Administered 2019-01-28 – 2019-02-01 (×3): 40 mg via SUBCUTANEOUS
  Filled 2019-01-27 (×5): qty 0.4

## 2019-01-27 MED ORDER — CEFAZOLIN SODIUM-DEXTROSE 2-3 GM-%(50ML) IV SOLR
INTRAVENOUS | Status: DC | PRN
  Administered 2019-01-27: 2 g via INTRAVENOUS

## 2019-01-27 MED ORDER — ACETAMINOPHEN 10 MG/ML IV SOLN
INTRAVENOUS | Status: DC | PRN
  Administered 2019-01-27: 1000 mg via INTRAVENOUS

## 2019-01-27 MED ORDER — BUPIVACAINE-EPINEPHRINE 0.5% -1:200000 IJ SOLN
INTRAMUSCULAR | Status: DC | PRN
  Administered 2019-01-27: 30 mL

## 2019-01-27 MED ORDER — LACTATED RINGERS IV SOLN
INTRAVENOUS | Status: DC | PRN
  Administered 2019-01-27: 12:00:00 via INTRAVENOUS

## 2019-01-27 MED ORDER — ROCURONIUM BROMIDE 100 MG/10ML IV SOLN
INTRAVENOUS | Status: DC | PRN
  Administered 2019-01-27: 10 mg via INTRAVENOUS
  Administered 2019-01-27: 40 mg via INTRAVENOUS

## 2019-01-27 MED ORDER — ACETAMINOPHEN 10 MG/ML IV SOLN
INTRAVENOUS | Status: AC
  Filled 2019-01-27: qty 100

## 2019-01-27 MED ORDER — VASOPRESSIN 20 UNIT/ML IV SOLN
INTRAVENOUS | Status: DC | PRN
  Administered 2019-01-27: 2 [IU] via INTRAVENOUS
  Administered 2019-01-27 (×2): 1 [IU] via INTRAVENOUS

## 2019-01-27 SURGICAL SUPPLY — 44 items
CANISTER SUCT 1200ML W/VALVE (MISCELLANEOUS) ×3 IMPLANT
CHLORAPREP W/TINT 26 (MISCELLANEOUS) ×3 IMPLANT
COVER WAND RF STERILE (DRAPES) ×3 IMPLANT
DRAPE LAPAROTOMY 100X77 ABD (DRAPES) ×3 IMPLANT
DRSG OPSITE POSTOP 4X10 (GAUZE/BANDAGES/DRESSINGS) ×1 IMPLANT
DRSG OPSITE POSTOP 4X8 (GAUZE/BANDAGES/DRESSINGS) ×3 IMPLANT
DRSG TEGADERM 4X10 (GAUZE/BANDAGES/DRESSINGS) ×1 IMPLANT
DRSG TELFA 3X8 NADH (GAUZE/BANDAGES/DRESSINGS) IMPLANT
ELECT REM PT RETURN 9FT ADLT (ELECTROSURGICAL) ×3
ELECTRODE REM PT RTRN 9FT ADLT (ELECTROSURGICAL) ×1 IMPLANT
GLOVE BIOGEL PI IND STRL 7.0 (GLOVE) ×1 IMPLANT
GLOVE BIOGEL PI INDICATOR 7.0 (GLOVE) ×4
GLOVE SURG SYN 6.5 ES PF (GLOVE) ×6 IMPLANT
GLOVE SURG SYN 6.5 PF PI (GLOVE) ×1 IMPLANT
GOWN STRL REUS W/ TWL LRG LVL3 (GOWN DISPOSABLE) ×2 IMPLANT
GOWN STRL REUS W/TWL LRG LVL3 (GOWN DISPOSABLE) ×6
KIT TURNOVER KIT A (KITS) ×3 IMPLANT
LABEL OR SOLS (LABEL) ×3 IMPLANT
LIGASURE IMPACT 36 18CM CVD LR (INSTRUMENTS) ×2 IMPLANT
NS IRRIG 1000ML POUR BTL (IV SOLUTION) ×3 IMPLANT
PACK BASIN MAJOR ARMC (MISCELLANEOUS) ×3 IMPLANT
PACK COLON CLEAN CLOSURE (MISCELLANEOUS) ×3 IMPLANT
PAD DRESSING TELFA 3X8 NADH (GAUZE/BANDAGES/DRESSINGS) ×1 IMPLANT
PENCIL SMOKE EVACUATOR (MISCELLANEOUS) ×2 IMPLANT
RELOAD PROXIMATE 75MM BLUE (ENDOMECHANICALS) ×6 IMPLANT
RELOAD STAPLE 75 3.8 BLU REG (ENDOMECHANICALS) IMPLANT
RELOAD STAPLER LINEAR PROX 30 (STAPLE) IMPLANT
SPONGE LAP 18X18 RF (DISPOSABLE) ×3 IMPLANT
STAPLER GUN LINEAR PROX 60 (STAPLE) ×2 IMPLANT
STAPLER PROXIMATE 75MM BLUE (STAPLE) ×2 IMPLANT
STAPLER RELOAD LINEAR PROX 30 (STAPLE)
STAPLER RELOADABLE 30 BLU REG (STAPLE) IMPLANT
STAPLER SKIN PROX 35W (STAPLE) ×2 IMPLANT
SUT PDS AB 1 TP1 54 (SUTURE) ×8 IMPLANT
SUT SILK 2 0 (SUTURE) ×2
SUT SILK 2-0 18XBRD TIE 12 (SUTURE) ×1 IMPLANT
SUT SILK 3 0 (SUTURE) ×2
SUT SILK 3-0 (SUTURE) ×2 IMPLANT
SUT SILK 3-0 18XBRD TIE 12 (SUTURE) ×1 IMPLANT
SUT VIC AB 2-0 SH 27 (SUTURE) ×2
SUT VIC AB 2-0 SH 27XBRD (SUTURE) IMPLANT
SUT VIC AB 3-0 SH 27 (SUTURE) ×4
SUT VIC AB 3-0 SH 27X BRD (SUTURE) ×2 IMPLANT
TRAY FOLEY MTR SLVR 16FR STAT (SET/KITS/TRAYS/PACK) ×3 IMPLANT

## 2019-01-27 NOTE — Progress Notes (Signed)
Subjective:  CC: Edward Mclaughlin is a 73 y.o. male  Hospital stay day 6, Day of Surgery Small bowel obstruction  HPI: Complaining of nausea again this am.   ROS:  General: Denies weight loss, weight gain, fatigue, fevers, chills, and night sweats. Heart: Denies chest pain, palpitations, racing heart, irregular heartbeat, leg pain or swelling, and decreased activity tolerance. Respiratory: Denies breathing difficulty, shortness of breath, wheezing, cough, and sputum. GI: Denies change in appetite, heartburn, constipation, diarrhea, and blood in stool. GU: Denies difficulty urinating, pain with urinating, urgency, frequency, blood in urine.   Objective:   Temp:  [97.7 F (36.5 C)-98.9 F (37.2 C)] 97.7 F (36.5 C) (10/27 1154) Pulse Rate:  [82-83] 82 (10/27 1154) Resp:  [16-18] 16 (10/27 1154) BP: (118-139)/(80-90) 118/80 (10/27 1154) SpO2:  [93 %-95 %] 93 % (10/27 1154) Weight:  [82 kg] 82 kg (10/27 1154)     Height: 5\' 10"  (177.8 cm) Weight: 82 kg BMI (Calculated): 25.94   Intake/Output this shift:   Intake/Output Summary (Last 24 hours) at 01/27/2019 1157 Last data filed at 01/27/2019 6578 Gross per 24 hour  Intake -  Output 2875 ml  Net -2875 ml    Constitutional :  alert, cooperative, appears stated age and no distress  Respiratory:  clear to auscultation bilaterally  Cardiovascular:  regular rate and rhythm  Gastrointestinal: Soft, no guarding, focal tenderness noted in all 4 quadrants again,still most tender in LLQ, mild distention.   Skin: Cool and moist.   Psychiatric: Normal affect, non-agitated, not confused       LABS:  CMP Latest Ref Rng & Units 01/27/2019 01/26/2019 01/25/2019  Glucose 70 - 99 mg/dL - 96 121(H)  BUN 8 - 23 mg/dL - 37(H) 25(H)  Creatinine 0.61 - 1.24 mg/dL - 1.00 0.95  Sodium 135 - 145 mmol/L - 144 142  Potassium 3.5 - 5.1 mmol/L 3.3(L) 2.8(L) 2.7(LL)  Chloride 98 - 111 mmol/L - 100 99  CO2 22 - 32 mmol/L - 28 34(H)  Calcium 8.9 -  10.3 mg/dL - 8.9 8.9  Total Protein 6.5 - 8.1 g/dL 6.8 6.5 6.5  Total Bilirubin 0.3 - 1.2 mg/dL 1.2 1.7(H) 1.3(H)  Alkaline Phos 38 - 126 U/L 49 50 43  AST 15 - 41 U/L 15 14(L) 18  ALT 0 - 44 U/L 14 15 18    CBC Latest Ref Rng & Units 01/26/2019 01/25/2019 01/24/2019  WBC 4.0 - 10.5 K/uL 7.3 5.0 4.6  Hemoglobin 13.0 - 17.0 g/dL 13.6 13.1 12.7(L)  Hematocrit 39.0 - 52.0 % 41.3 40.2 38.3(L)  Platelets 150 - 400 K/uL 185 194 192    RADS: n/a Assessment:   SBO, persistent, failed p.o. trial over the weekend.  Persistent nausea again.  He is well aware of his situation this am, and I specifically asked him if he is still ok with proceeding with surgery, patient verbalized yes.  Will proceed as scheduled.

## 2019-01-27 NOTE — Anesthesia Post-op Follow-up Note (Signed)
Anesthesia QCDR form completed.        

## 2019-01-27 NOTE — OR Nursing (Signed)
One brown wallet with 9 dollars ( one 5 dollar bill and four 1 dollar bills) was found in the patient's bed. I verified the money amount with the patient and the CRNA, Kim, and I placed the wallet in the chart during surgery.

## 2019-01-27 NOTE — Anesthesia Preprocedure Evaluation (Addendum)
Anesthesia Evaluation  Patient identified by MRN, date of birth, ID band Patient awake    Reviewed: Allergy & Precautions, NPO status , Patient's Chart, lab work & pertinent test results  History of Anesthesia Complications Negative for: history of anesthetic complications  Airway Mallampati: II  TM Distance: >3 FB Neck ROM: Full    Dental  (+) Edentulous Upper, Edentulous Lower   Pulmonary COPD: no documented diagnosis but lifetime smoker., Current Smoker and Patient abstained from smoking.,           Cardiovascular (-) hypertension(-) CAD, (-) Past MI, (-) Cardiac Stents and (-) CABG      Neuro/Psych neg Seizures PSYCHIATRIC DISORDERS negative neurological ROS     GI/Hepatic Neg liver ROS, SBO   Endo/Other  negative endocrine ROSneg diabetes  Renal/GU negative Renal ROS     Musculoskeletal negative musculoskeletal ROS (+)   Abdominal (+) - obese,   Peds  Hematology negative hematology ROS (+)   Anesthesia Other Findings    Reproductive/Obstetrics                            Anesthesia Physical Anesthesia Plan  ASA: II  Anesthesia Plan: General   Post-op Pain Management:    Induction: Intravenous and Rapid sequence  PONV Risk Score and Plan: 1 and Ondansetron  Airway Management Planned: Oral ETT  Additional Equipment:   Intra-op Plan:   Post-operative Plan: Extubation in OR  Informed Consent: I have reviewed the patients History and Physical, chart, labs and discussed the procedure including the risks, benefits and alternatives for the proposed anesthesia with the patient or authorized representative who has indicated his/her understanding and acceptance.     Dental advisory given  Plan Discussed with: CRNA and Anesthesiologist  Anesthesia Plan Comments: (SBO failed conservative management, pt has refused NG tube, plan for RSI and NG placement for decompression post  induction)       Anesthesia Quick Evaluation

## 2019-01-27 NOTE — Anesthesia Procedure Notes (Signed)
Procedure Name: Intubation Date/Time: 01/27/2019 12:40 PM Performed by: Allean Found, CRNA Pre-anesthesia Checklist: Patient identified, Patient being monitored, Timeout performed, Emergency Drugs available and Suction available Patient Re-evaluated:Patient Re-evaluated prior to induction Oxygen Delivery Method: Circle system utilized Preoxygenation: Pre-oxygenation with 100% oxygen Induction Type: IV induction Ventilation: Mask ventilation without difficulty Laryngoscope Size: Mac and 4 Grade View: Grade I Tube type: Oral Tube size: 7.5 mm Number of attempts: 1 Airway Equipment and Method: Stylet Placement Confirmation: ETT inserted through vocal cords under direct vision,  positive ETCO2 and breath sounds checked- equal and bilateral Secured at: 24 cm Tube secured with: Tape Dental Injury: Teeth and Oropharynx as per pre-operative assessment

## 2019-01-27 NOTE — Transfer of Care (Signed)
Immediate Anesthesia Transfer of Care Note  Patient: Edward Mclaughlin  Procedure(s) Performed: EXPLORATORY LAPAROTOMY (N/A ) LYSIS OF ADHESION (N/A )  Patient Location: PACU  Anesthesia Type:General  Level of Consciousness: sedated  Airway & Oxygen Therapy: Patient Spontanous Breathing and Patient connected to face mask oxygen  Post-op Assessment: Report given to RN and Post -op Vital signs reviewed and stable  Post vital signs: Reviewed and stable  Last Vitals:  Vitals Value Taken Time  BP 110/65 01/27/19 1507  Temp    Pulse 75 01/27/19 1515  Resp 20 01/27/19 1515  SpO2 95 % 01/27/19 1515  Vitals shown include unvalidated device data.  Last Pain:  Vitals:   01/27/19 1154  TempSrc: Temporal  PainSc: 0-No pain         Complications: No apparent anesthesia complications

## 2019-01-27 NOTE — Op Note (Signed)
Preoperative diagnosis: Small bowel obstruction Postoperative diagnosis: Same Procedure: Exploratory laparotomy, small bowel resection  Anesthesia: GETA  Surgeon: Benjamine Sprague  Wound Classification: clean contaminated  Specimen: Small bowel and margin  Complications: None  Estimated Blood Loss: 20 mL   Indications: Patient is a 73 y.o. male  presented with above.  Patient initially did not consent to NG tube or surgical management.  However patient changed his mind eventually and opted for surgical management.  Please see progress notes for further details.  Findings: 1.  Adhesions secondary to previous open cholecystectomy causing high-grade small bowel obstruction. 2.  Bowel viability in question and small enterotomy noted at the adhesion site.  Small bowel resection and reanastomosis completed at this time  Description of procedure: The patient was placed on the operating table in the supine position, left arm tucked. General anesthesia was induced. A time-out was completed verifying correct patient, procedure, site, positioning, and implant(s) and/or special equipment prior to beginning this procedure. A Foley catheter placed. The abdomen was prepped and draped in the usual sterile fashion.   Midline incision was made and dissection carried down to the fascia.  Upon entering the abdominal cavity multiple loops of dilated small bowel noted as expected.  Inspection of the small bowel noted a abrupt transition point at a dense adhesion located in the right upper quadrant.  During the exploration, no enterotomy was noted at this point of adhesion.  With the degree of adhesions noted surrounding it as well, and was made to proceed with a small bowel resection and reanastomosis.  GIA stapler with blue load was placed at a point distal and proximal to this area of adhesion, bowel was noted to be healthy.  After transection of the small bowel points, LigaSure was used to follow the mesentery down  to the area of adhesion.  After confirmation of no additional structures attached to this dense adhesive band, LigaSure was used to transected and the small bowel specimen was removed off operative field pending pathology.  2 small bowel ends were then brought together and secured next each other using 3-0 silk.  Enterotomies were then made at the staple lines and a new Endo GIA stapler was placed into the enterotomies to create a new right side anastomosis.  Approximately 900 mL of small bowel contents were suctioned out prior to the new anastomosis formation when the enterotomies were made.  The enterotomies were then closed with TA stapler and the newly created staple line was Lembert sutured using 3-0 silk in a running fashion.  The mesenteric defect was then approximated using 2-0 Vicryl in a continuous fashion.  Afterwards, confirmation was noted of the mesentery not twisted in the bowels remaining viable.  Abdominal cavity was then extensively irrigated with 3 L of irrigation, prior to closing the midline incision.  Clean closure protocol was initiated.  Exparel was infused along the midline incision prior to closing with 1 PDS x2.  Wound was irrigated one last time prior to closing with skin stapler.  The patient tolerated the procedure well, foley removed.  Gastric contents noted to be surrounding the mouth prior to extubation and this was all suctioned out, and NG tube was placed with approximately 2 L of output noted.  NG tube was initially initially not placed due to the patient refusal, but due to the large amount of gastric output and the very high risk of aspiration NG tube was placed prior to extubation.    Patient was then awakened from  anesthesia and was taken to the postanesthesia care unit in satisfactory condition.  Sponge count and instrument count correct at the end of the procedure.

## 2019-01-28 ENCOUNTER — Encounter: Payer: Self-pay | Admitting: Surgery

## 2019-01-28 LAB — CBC
HCT: 38.7 % — ABNORMAL LOW (ref 39.0–52.0)
Hemoglobin: 12.6 g/dL — ABNORMAL LOW (ref 13.0–17.0)
MCH: 30.1 pg (ref 26.0–34.0)
MCHC: 32.6 g/dL (ref 30.0–36.0)
MCV: 92.4 fL (ref 80.0–100.0)
Platelets: 187 10*3/uL (ref 150–400)
RBC: 4.19 MIL/uL — ABNORMAL LOW (ref 4.22–5.81)
RDW: 13.8 % (ref 11.5–15.5)
WBC: 10.7 10*3/uL — ABNORMAL HIGH (ref 4.0–10.5)
nRBC: 0 % (ref 0.0–0.2)

## 2019-01-28 LAB — BASIC METABOLIC PANEL
Anion gap: 12 (ref 5–15)
BUN: 57 mg/dL — ABNORMAL HIGH (ref 8–23)
CO2: 31 mmol/L (ref 22–32)
Calcium: 8.1 mg/dL — ABNORMAL LOW (ref 8.9–10.3)
Chloride: 103 mmol/L (ref 98–111)
Creatinine, Ser: 1.39 mg/dL — ABNORMAL HIGH (ref 0.61–1.24)
GFR calc Af Amer: 58 mL/min — ABNORMAL LOW (ref >60)
GFR calc non Af Amer: 50 mL/min — ABNORMAL LOW (ref >60)
Glucose, Bld: 130 mg/dL — ABNORMAL HIGH (ref 70–99)
Potassium: 3.5 mmol/L (ref 3.5–5.1)
Sodium: 146 mmol/L — ABNORMAL HIGH (ref 135–145)

## 2019-01-28 LAB — HEPATIC FUNCTION PANEL
ALT: 12 U/L (ref 0–44)
AST: 15 U/L (ref 15–41)
Albumin: 2.8 g/dL — ABNORMAL LOW (ref 3.5–5.0)
Alkaline Phosphatase: 37 U/L — ABNORMAL LOW (ref 38–126)
Bilirubin, Direct: 0.3 mg/dL — ABNORMAL HIGH (ref 0.0–0.2)
Indirect Bilirubin: 0.8 mg/dL (ref 0.3–0.9)
Total Bilirubin: 1.1 mg/dL (ref 0.3–1.2)
Total Protein: 5.2 g/dL — ABNORMAL LOW (ref 6.5–8.1)

## 2019-01-28 LAB — MAGNESIUM: Magnesium: 2.1 mg/dL (ref 1.7–2.4)

## 2019-01-28 LAB — PHOSPHORUS: Phosphorus: 3.7 mg/dL (ref 2.5–4.6)

## 2019-01-28 MED ORDER — SODIUM CHLORIDE 0.9 % IV SOLN
INTRAVENOUS | Status: DC | PRN
  Administered 2019-01-28: 250 mL via INTRAVENOUS

## 2019-01-28 NOTE — Anesthesia Postprocedure Evaluation (Signed)
Anesthesia Post Note  Patient: Edward Mclaughlin  Procedure(s) Performed: EXPLORATORY LAPAROTOMY (N/A ) LYSIS OF ADHESION (N/A )  Patient location during evaluation: PACU Anesthesia Type: General Level of consciousness: awake and alert and oriented Pain management: pain level controlled Vital Signs Assessment: post-procedure vital signs reviewed and stable Respiratory status: spontaneous breathing, nonlabored ventilation and respiratory function stable Cardiovascular status: blood pressure returned to baseline and stable Postop Assessment: no signs of nausea or vomiting Anesthetic complications: no     Last Vitals:  Vitals:   01/27/19 2252 01/28/19 1157  BP: 95/66 106/73  Pulse: 83 79  Resp:    Temp:  36.8 C  SpO2:      Last Pain:  Vitals:   01/28/19 1157  TempSrc: Oral  PainSc:                  Wacey Zieger

## 2019-01-28 NOTE — Progress Notes (Addendum)
Subjective:  CC: Edward Mclaughlin is a 73 y.o. male  Hospital stay day 7, 1 Day Post-Op ex-lap, SB resection for SBO HPI: Episodes of hypotension overnight, resolved with bolus.  No active complaints this am.  No flatus or BM   ROS:  General: Denies weight loss, weight gain, fatigue, fevers, chills, and night sweats. Heart: Denies chest pain, palpitations, racing heart, irregular heartbeat, leg pain or swelling, and decreased activity tolerance. Respiratory: Denies breathing difficulty, shortness of breath, wheezing, cough, and sputum. GI: Denies change in appetite, heartburn, constipation, diarrhea, and blood in stool. GU: Denies difficulty urinating, pain with urinating, urgency, frequency, blood in urine.   Objective:   Temp:  [97.7 F (36.5 C)-98.5 F (36.9 C)] 97.7 F (36.5 C) (10/27 1931) Pulse Rate:  [75-86] 83 (10/27 2252) Resp:  [13-24] 18 (10/27 1931) BP: (80-118)/(51-80) 95/66 (10/27 2252) SpO2:  [89 %-96 %] 92 % (10/27 2000) Weight:  [82 kg] 82 kg (10/27 1154)     Height: 5\' 10"  (177.8 cm) Weight: 82 kg BMI (Calculated): 25.94   Intake/Output this shift:   Intake/Output Summary (Last 24 hours) at 01/28/2019 0830 Last data filed at 01/28/2019 0748 Gross per 24 hour  Intake 3045 ml  Output 1515 ml  Net 1530 ml    Constitutional :  alert, cooperative, appears stated age and no distress  Respiratory:  clear to auscultation bilaterally  Cardiovascular:  regular rate and rhythm  Gastrointestinal: Soft, no guarding, tenderness noted in all 4 quadrants as expected, but less tender.  midline incision c/d/i.   Skin: Cool and moist.   Psychiatric: Normal affect, non-agitated, not confused       LABS:  CMP Latest Ref Rng & Units 01/28/2019 01/27/2019 01/26/2019  Glucose 70 - 99 mg/dL 130(H) - 96  BUN 8 - 23 mg/dL 57(H) - 37(H)  Creatinine 0.61 - 1.24 mg/dL 1.39(H) 1.25(H) 1.00  Sodium 135 - 145 mmol/L 146(H) - 144  Potassium 3.5 - 5.1 mmol/L 3.5 3.3(L) 2.8(L)   Chloride 98 - 111 mmol/L 103 - 100  CO2 22 - 32 mmol/L 31 - 28  Calcium 8.9 - 10.3 mg/dL 8.1(L) - 8.9  Total Protein 6.5 - 8.1 g/dL 5.2(L) 6.8 6.5  Total Bilirubin 0.3 - 1.2 mg/dL 1.1 1.2 1.7(H)  Alkaline Phos 38 - 126 U/L 37(L) 49 50  AST 15 - 41 U/L 15 15 14(L)  ALT 0 - 44 U/L 12 14 15    CBC Latest Ref Rng & Units 01/28/2019 01/27/2019 01/26/2019  WBC 4.0 - 10.5 K/uL 10.7(H) 3.5(L) 7.3  Hemoglobin 13.0 - 17.0 g/dL 12.6(L) 13.2 13.6  Hematocrit 39.0 - 52.0 % 38.7(L) 40.7 41.3  Platelets 150 - 400 K/uL 187 173 185    RADS: n/a Assessment:   S/p ex-lap, SB resection for SBO.  As expected today, continue NG until ROBF. Ambulate, gum and hard candy ok.  Elevated Cr- likely dehydration.  Will increase fluid rate and continue to monitor urine output.   Continue abx due to spillage of content during procedure.  High chance of abscess formation.  DVT/GI prophylaxis

## 2019-01-28 NOTE — Evaluation (Signed)
Physical Therapy Evaluation Patient Details Name: Edward Mclaughlin MRN: [id number] DOB: 05-Mar-1946 Today's Date: 01/28/2019   History of Present Illness  presented to ER after being found wandering in parking lot with repeated emesis; admitted for management of AMS, SBO.  Attempted decompression via NGT, but patient consistently refusing NGT.  Surgery remains option, but attempting conservative mangaement.  Now, status post ex-lap with small bowel resection (10/27).  Clinical Impression  Patient awake upon arrival to room, strumming guitar supine in bed.  Agreeable to session with mod encouragement, but generally limited due to abdominal pain.  Remains globally weak and deconditioned, strength grossly 3-/5 throughout all extremities and somewhat clumsy/discoordinated with functional movement.  Generally guarded with all movement due to pain.  Currently requiring mod/max assist for rolling and bed mobility; close sup for unsupported sitting edge of bed.  Refused repeated attempts/encouragement at sit/stand or OOB activities; unable to encourage/redirect.  Will continue to assess/progress as medically appropriate and patient allows. Would benefit from skilled PT to address above deficits and promote optimal return to PLOF.; recommend transition to STR upon discharge from acute hospitalization.     Follow Up Recommendations SNF    Equipment Recommendations       Recommendations for Other Services       Precautions / Restrictions Precautions Precautions: Fall Precaution Comments: NPO, NGT Restrictions Weight Bearing Restrictions: No      Mobility  Bed Mobility Overal bed mobility: Needs Assistance Bed Mobility: Supine to Sit;Rolling;Sit to Supine Rolling: Mod assist;Max assist   Supine to sit: Mod assist Sit to supine: Mod assist   General bed mobility comments: significant increase in level of assist required due to abdominal pain  Transfers                 General  transfer comment: patient refused due to pain  Ambulation/Gait             General Gait Details: patient refused due to pain  Stairs            Wheelchair Mobility    Modified Rankin (Stroke Patients Only)       Balance Overall balance assessment: Needs assistance Sitting-balance support: No upper extremity supported;Feet supported Sitting balance-Leahy Scale: Good         Standing balance comment: patient refused due to pain                             Pertinent Vitals/Pain Pain Assessment: Faces Faces Pain Scale: Hurts whole lot Pain Location: abdomen Pain Descriptors / Indicators: Aching;Guarding;Grimacing;Moaning Pain Intervention(s): Limited activity within patient's tolerance;Monitored during session;Repositioned;Patient requesting pain meds-RN notified    Home Living Family/patient expects to be discharged to:: Shelter/Homeless Living Arrangements: Alone   Type of Home: Homeless                Prior Function Level of Independence: Independent         Comments: Patient poor historian, questionable report; however, suggests indep without assist device     Hand Dominance        Extremity/Trunk Assessment   Upper Extremity Assessment Upper Extremity Assessment: Generalized weakness    Lower Extremity Assessment Lower Extremity Assessment: Generalized weakness(grossly 3-/5 throughout; generally clumsy and discoordinated)       Communication   Communication: (generally slurred at times (edentulous), difficulty following conversation and answering questions appropriately)  Cognition Arousal/Alertness: Awake/alert Behavior During Therapy: Flat affect Overall Cognitive Status: No family/caregiver present  to determine baseline cognitive functioning                                 General Comments: oriented to self only; follows commands, optimized with demonstration from therapist      General Comments       Exercises Other Exercises Other Exercises: Rolling bilat, mod/max assist for LE positioning, hand-over-hand for UE placement; limited active effort with bed mobility due to pain Other Exercises: Attempted education related to log rolling and abdominal bracing/protection; limited comprehension and integration of information.   Assessment/Plan    PT Assessment Patient needs continued PT services  PT Problem List Decreased strength;Decreased range of motion;Decreased activity tolerance;Decreased balance;Decreased mobility;Decreased coordination;Decreased cognition;Decreased knowledge of use of DME;Decreased safety awareness;Decreased knowledge of precautions       PT Treatment Interventions DME instruction;Gait training;Stair training;Functional mobility training;Therapeutic activities;Therapeutic exercise;Balance training;Cognitive remediation;Patient/family education    PT Goals (Current goals can be found in the Care Plan section)  Acute Rehab PT Goals Patient Stated Goal: agreeable to session with encouragment PT Goal Formulation: With patient Time For Goal Achievement: 02/09/19 Potential to Achieve Goals: Fair    Frequency Min 2X/week   Barriers to discharge Decreased caregiver support      Co-evaluation               AM-PAC PT "6 Clicks" Mobility  Outcome Measure Help needed turning from your back to your side while in a flat bed without using bedrails?: A Lot Help needed moving from lying on your back to sitting on the side of a flat bed without using bedrails?: A Lot Help needed moving to and from a bed to a chair (including a wheelchair)?: A Lot Help needed standing up from a chair using your arms (e.g., wheelchair or bedside chair)?: Total Help needed to walk in hospital room?: Total Help needed climbing 3-5 steps with a railing? : Total 6 Click Score: 9    End of Session   Activity Tolerance: Patient limited by pain Patient left: in bed;with call  bell/phone within reach;with nursing/sitter in room Nurse Communication: Mobility status PT Visit Diagnosis: Muscle weakness (generalized) (M62.81);Difficulty in walking, not elsewhere classified (R26.2)    Time: 1937-9024 PT Time Calculation (min) (ACUTE ONLY): 26 min   Charges:   PT Evaluation $PT Re-evaluation: 1 Re-eval PT Treatments $Therapeutic Activity: 8-22 mins        Edeline Greening H. Manson Passey, PT, DPT, NCS 01/28/19, 10:27 AM [phone]

## 2019-01-28 NOTE — TOC Progression Note (Signed)
Transition of Care [facility]) - Progression Note    Patient Details  Name: Edward Mclaughlin MRN: [id number] Date of Birth: [birth year]  Transition of Care [facility]) CM/SW Contact  Beverly Sessions, RN Phone Number: 01/28/2019, 4:15 PM  Clinical Narrative:    Updated Fl2 sent to [facility] MUST   Expected Discharge Plan: [place]    Expected Discharge Plan and Services Expected Discharge Plan: [place]       Living arrangements for the past 2 months: Homeless                                       Social Determinants of Health (SDOH) Interventions    Readmission Risk Interventions No flowsheet data found.

## 2019-01-28 NOTE — NC FL2 (Signed)
[place] LEVEL OF CARE SCREENING TOOL     IDENTIFICATION  Patient Name: Jaking Thayer Birthdate: 04-22-1945 Sex: male Admission Date (Current Location): 01/21/2019  [place] and [place] Number:  Engineering geologist and Address:  [facility], [address], [place], [place] [address]      Provider Number: [id number]  Attending Physician Name and Address:  Benjamine Sprague, DO  Relative Name and Phone Number:       Current Level of Care: Hospital Recommended Level of Care: [place] Prior Approval Number:    Date Approved/Denied:   PASRR Number: Pending  Discharge Plan: SNF    Current Diagnoses: Patient Active Problem List   Diagnosis Date Noted  . Acute adjustment disorder 01/25/2019  . Encounter for competency evaluation   . SBO (small bowel obstruction) ([place]) 01/21/2019    Orientation RESPIRATION BLADDER Height & Weight     Self, Situation, Place  Normal Continent Weight: 82 kg Height:  5\' 10"  (177.8 cm)  BEHAVIORAL SYMPTOMS/MOOD NEUROLOGICAL BOWEL NUTRITION STATUS      Continent Diet(NPO will advance prior to diccharge)  AMBULATORY STATUS COMMUNICATION OF NEEDS Skin   Limited Assist Verbally Normal                       Personal Care Assistance Level of Assistance  Dressing, Feeding   Feeding assistance: Independent Dressing Assistance: Independent     Functional Limitations Info  Sight Sight Info: Impaired        SPECIAL CARE FACTORS FREQUENCY                       Contractures Contractures Info: Not present    Additional Factors Info  Code Status, Allergies Code Status Info: full Allergies Info: NKDA           Current Medications (01/28/2019):  This is the current hospital active medication list Current Facility-Administered Medications  Medication Dose Route Frequency Provider Last Rate Last Dose  . dextrose 5 % and 0.45 % NaCl with KCl 40 mEq/L infusion    Intravenous Continuous Sakai, Isami, DO 125 mL/hr at 01/28/19 0911    . enoxaparin (LOVENOX) injection 40 mg  40 mg Subcutaneous Q24H Sakai, Isami, DO   40 mg at 01/28/19 0756  . famotidine (PEPCID) tablet 20 mg  20 mg Oral Daily Sakai, Isami, DO   20 mg at 01/28/19 1047  . ketorolac (TORADOL) 15 MG/ML injection 15 mg  15 mg Intravenous Q6H PRN Sakai, Isami, DO      . morphine 2 MG/ML injection 2 mg  2 mg Intravenous Q3H PRN Sakai, Isami, DO   2 mg at 01/28/19 1049  . ondansetron (ZOFRAN-ODT) disintegrating tablet 4 mg  4 mg Oral Q6H PRN Lysle Pearl, Isami, DO       Or  . ondansetron (ZOFRAN) injection 4 mg  4 mg Intravenous Q6H PRN Sakai, Isami, DO   4 mg at 01/27/19 0225  . pantoprazole (PROTONIX) injection 40 mg  40 mg Intravenous QHS Sakai, Isami, DO   40 mg at 01/27/19 2059  . piperacillin-tazobactam (ZOSYN) IVPB 3.375 g  3.375 g Intravenous Q8H Sakai, Isami, DO 12.5 mL/hr at 01/28/19 0501 3.375 g at 01/28/19 0501  . sodium chloride 0.9 % bolus 500 mL  500 mL Intravenous Once Elder Cyphers, NP   Stopped at 01/27/19 2211     Discharge Medications: Please see discharge summary for a list of discharge medications.  Relevant Imaging Results:  Relevant Lab Results:   Additional Information ss [id number]  Chapman Fitch, RN

## 2019-01-28 NOTE — Progress Notes (Signed)
01/28/2019 5:09 PM  Called into patients room and informed that his nasogastric tube had come out.  Patient insists "It just fell out."  Instructed by Dr. Lysle Pearl to re-insert.  I asked if I could place the tube again and the patient said "Do what you need to."  When I began the attempt the patient began swinging his arm and trying to hit me.  I aborted the attempt and let Dr. Lysle Pearl know.  No new orders at this time.  Will continue to monitor and assess. Dola Argyle, RN

## 2019-01-28 NOTE — TOC Progression Note (Signed)
Transition of Care [facility]) - Progression Note    Patient Details  Name: Miner Koral MRN: [id number] Date of Birth: [birth year]  Transition [facility]) CM/SW Contact  Beverly Sessions, RN Phone Number: 01/28/2019, 4:25 PM  Clinical Narrative:     Bed search extended to [facility] and [facility] in [place]   Expected Discharge Plan: [place]    Expected Discharge Plan and Services Expected Discharge Plan: [place] arrangements for the past 2 months: Homeless                                       Social Determinants of Health (SDOH) Interventions    Readmission Risk Interventions No flowsheet data found.

## 2019-01-28 NOTE — Plan of Care (Signed)
BP was soft MD on call was called, IV bolus fluids given, BP responded to the intervention, denies pain this shift, abdominal dressing is c/I with little drainage noted, received IV Zosyn without any reactions, A/Ox4, NPO, NG tube intact, voiding adequately via uranal, safety measures intact, will continue to monitor.  Problem: Education: Goal: Knowledge of General Education information will improve Description: Including pain rating scale, medication(s)/side effects and non-pharmacologic comfort measures Outcome: Progressing   Problem: Health Behavior/Discharge Planning: Goal: Ability to manage health-related needs will improve Outcome: Progressing   Problem: Clinical Measurements: Goal: Ability to maintain clinical measurements within normal limits will improve Outcome: Progressing Goal: Will remain free from infection Outcome: Progressing Goal: Diagnostic test results will improve Outcome: Progressing Goal: Respiratory complications will improve Outcome: Progressing Goal: Cardiovascular complication will be avoided Outcome: Progressing   Problem: Activity: Goal: Risk for activity intolerance will decrease Outcome: Progressing   Problem: Nutrition: Goal: Adequate nutrition will be maintained Outcome: Progressing   Problem: Coping: Goal: Level of anxiety will decrease Outcome: Progressing   Problem: Elimination: Goal: Will not experience complications related to bowel motility Outcome: Progressing Goal: Will not experience complications related to urinary retention Outcome: Progressing   Problem: Pain Managment: Goal: General experience of comfort will improve Outcome: Progressing   Problem: Safety: Goal: Ability to remain free from injury will improve Outcome: Progressing   Problem: Skin Integrity: Goal: Risk for impaired skin integrity will decrease Outcome: Progressing

## 2019-01-29 LAB — SURGICAL PATHOLOGY

## 2019-01-29 LAB — GLUCOSE, CAPILLARY: Glucose-Capillary: 116 mg/dL — ABNORMAL HIGH (ref 70–99)

## 2019-01-29 MED ORDER — DEXTROSE-NACL 5-0.45 % IV SOLN
INTRAVENOUS | Status: DC
  Administered 2019-01-29 – 2019-01-30 (×3): via INTRAVENOUS

## 2019-01-29 NOTE — Progress Notes (Signed)
Nutrition Follow Up Note   DOCUMENTATION CODES:   Not applicable  INTERVENTION:   Recommend initiation of TPN as pt has been without nutrition for > 7 days  Pt is at moderate refeed risk  NUTRITION DIAGNOSIS:   Inadequate oral intake related to acute illness(SBO) as evidenced by NPO status.  GOAL:   Patient will meet greater than or equal to 90% of their needs  -not met   MONITOR:   Diet advancement, Labs, Weight trends, Skin, I & O's   ASSESSMENT:   73 y.o. male with small bowel obstruction, complicated by pertinent comorbidities including history of schizophrenia.  Pt s/p ex lap and small resection 10/27  Pt with post op ileus; reports continued nausea and abdominal pain. NGT came out this morning and pt is refusing replacement. PICC line and TPN offered by MD, but patient refusing. .     Medications reviewed and include: Lovenox, protonix, NaCl w/ KCl & 5% dextrose _0 /hr, zosyn  Labs reviewed: K 3.5 wnl, P 3.7 wnl, Mg 2.1 wnl Wbc- 10.7(H)  Diet Order:   Diet Order            Diet NPO time specified Except for: Sips with Meds  Diet effective now             EDUCATION NEEDS:   Not appropriate for education at this time  Skin:  Skin Assessment: Reviewed RN Assessment(wound toe)  Last BM:  10/27- type 1  Height:   Ht Readings from Last 1 Encounters:  01/27/19 _1  (1.778 m)    Weight:   Wt Readings from Last 1 Encounters:  01/27/19 82 kg    Ideal Body Weight:  75.4 kg  BMI:  Body mass index is 25.94 kg/m.  Estimated Nutritional Needs:   Kcal:  1800-2100kcal/day  Protein:  90-105g/day  Fluid:  >1.9L/day  Koleen Distance MS, RD, LDN Pager #- [phone] Office#- [phone] After Hours Pager: [phone]

## 2019-01-29 NOTE — Care Management Important Message (Signed)
Important Message  Patient Details  Name: Tavaris Eudy MRN: [id number] Date of Birth: 04/16/1945   Medicare Important Message Given:  Yes     Juliann Pulse A Naheim Burgen 01/29/2019, 11:18 AM

## 2019-01-29 NOTE — TOC Progression Note (Signed)
Transition of Care [facility]) - Progression Note    Patient Details  Name: Dannis Deroche MRN: [id number] Date of Birth: February 24, 1946  Transition of Care [facility]) CM/SW Contact  Beverly Sessions, RN Phone Number: 01/29/2019, 2:02 PM  Clinical Narrative:     Level 2 PASRR still pending.  Message left for [place] must No bed offers currently  Additional clinical sent to [facility] and rehab  Expected Discharge Plan: [place]    Expected Discharge Plan and Services Expected Discharge Plan: [place] arrangements for the past 2 months: Homeless                                       Social Determinants of Health (SDOH) Interventions    Readmission Risk Interventions No flowsheet data found.

## 2019-01-29 NOTE — TOC Transition Note (Signed)
Transition of Care [facility]) - CM/SW Discharge Note   Patient Details  Name: Edward Mclaughlin MRN: [id number] Date of Birth: [birth year]  Transition of Care [facility]) CM/SW Contact:  Beverly Sessions, RN Phone Number: 01/29/2019, 3:35 PM   Clinical Narrative:    RNCM spoke with Joy from [facility] and provided additional information          Patient Goals and CMS Choice        Discharge Placement                       Discharge Plan and Services                                     Social Determinants of Health (SDOH) Interventions     Readmission Risk Interventions No flowsheet data found.

## 2019-01-29 NOTE — Progress Notes (Addendum)
Subjective:  CC: Edward Mclaughlin is a 73 y.o. male  Hospital stay day 8, 2 Days Post-Op ex-lap, SB resection for SBO HPI: NG pulled last night.  Complaining of nausea now, but refusing NG.  Complains of pain.   ROS:  General: Denies weight loss, weight gain, fatigue, fevers, chills, and night sweats. Heart: Denies chest pain, palpitations, racing heart, irregular heartbeat, leg pain or swelling, and decreased activity tolerance. Respiratory: Denies breathing difficulty, shortness of breath, wheezing, cough, and sputum. GI: Denies change in appetite, heartburn, constipation, diarrhea, and blood in stool. GU: Denies difficulty urinating, pain with urinating, urgency, frequency, blood in urine.   Objective:   Temp:  [98.2 F (36.8 C)-99 F (37.2 C)] 98.3 F (36.8 C) (10/29 0502) Pulse Rate:  [79-90] 87 (10/29 0502) Resp:  [16] 16 (10/29 0502) BP: (106-133)/(73-85) 133/85 (10/29 0502) SpO2:  [82 %-92 %] 92 % (10/29 0502)     Height: 5\' 10"  (177.8 cm) Weight: 82 kg BMI (Calculated): 25.94   Intake/Output this shift:   Intake/Output Summary (Last 24 hours) at 01/29/2019 1003 Last data filed at 01/28/2019 1733 Gross per 24 hour  Intake 605.78 ml  Output 950 ml  Net -344.22 ml    Constitutional :  alert, cooperative, appears stated age and no distress  Respiratory:  clear to auscultation bilaterally  Cardiovascular:  regular rate and rhythm  Gastrointestinal: Soft, no guarding, tenderness noted in all 4 quadrants as expected, but less tender.  midline incision c/d/i.   Skin: Cool and moist.   Psychiatric: Normal affect, non-agitated, not confused       LABS:  CMP Latest Ref Rng & Units 01/28/2019 01/27/2019 01/26/2019  Glucose 70 - 99 mg/dL 130(H) - 96  BUN 8 - 23 mg/dL 57(H) - 37(H)  Creatinine 0.61 - 1.24 mg/dL 1.39(H) 1.25(H) 1.00  Sodium 135 - 145 mmol/L 146(H) - 144  Potassium 3.5 - 5.1 mmol/L 3.5 3.3(L) 2.8(L)  Chloride 98 - 111 mmol/L 103 - 100  CO2 22 - 32  mmol/L 31 - 28  Calcium 8.9 - 10.3 mg/dL 8.1(L) - 8.9  Total Protein 6.5 - 8.1 g/dL 5.2(L) 6.8 6.5  Total Bilirubin 0.3 - 1.2 mg/dL 1.1 1.2 1.7(H)  Alkaline Phos 38 - 126 U/L 37(L) 49 50  AST 15 - 41 U/L 15 15 14(L)  ALT 0 - 44 U/L 12 14 15    CBC Latest Ref Rng & Units 01/28/2019 01/27/2019 01/26/2019  WBC 4.0 - 10.5 K/uL 10.7(H) 3.5(L) 7.3  Hemoglobin 13.0 - 17.0 g/dL 12.6(L) 13.2 13.6  Hematocrit 39.0 - 52.0 % 38.7(L) 40.7 41.3  Platelets 150 - 400 K/uL 187 173 185    RADS: n/a Assessment:   S/p ex-lap, SB resection for SBO.  Refusing NG, likely has post op ileus.  No flatus or BM.  I offered TPN via PICC line placement but patient specifically refused.  I stated risk of continued malnutrition includes but not limited to further decline in health and possible death.  Pt stated he understand but still refusing PICC.    Elevated Cr- likely dehydration.  Urine output slightly better.  Refusing labs.  Will ask to try again to monitor.  Continue abx due to spillage of content during procedure.  High chance of abscess formation.  DVT/GI prophylaxis

## 2019-01-30 ENCOUNTER — Inpatient Hospital Stay: Payer: Medicare Other

## 2019-01-30 LAB — BASIC METABOLIC PANEL
Anion gap: 9 (ref 5–15)
BUN: 23 mg/dL (ref 8–23)
CO2: 24 mmol/L (ref 22–32)
Calcium: 7.9 mg/dL — ABNORMAL LOW (ref 8.9–10.3)
Chloride: 110 mmol/L (ref 98–111)
Creatinine, Ser: 0.92 mg/dL (ref 0.61–1.24)
GFR calc Af Amer: >60 mL/min (ref >60)
GFR calc non Af Amer: >60 mL/min (ref >60)
Glucose, Bld: 93 mg/dL (ref 70–99)
Potassium: 3.3 mmol/L — ABNORMAL LOW (ref 3.5–5.1)
Sodium: 143 mmol/L (ref 135–145)

## 2019-01-30 LAB — PHOSPHORUS: Phosphorus: 1.8 mg/dL — ABNORMAL LOW (ref 2.5–4.6)

## 2019-01-30 LAB — HEPATIC FUNCTION PANEL
ALT: 11 U/L (ref 0–44)
AST: 19 U/L (ref 15–41)
Albumin: 2.4 g/dL — ABNORMAL LOW (ref 3.5–5.0)
Alkaline Phosphatase: 41 U/L (ref 38–126)
Bilirubin, Direct: 0.2 mg/dL (ref 0.0–0.2)
Indirect Bilirubin: 0.6 mg/dL (ref 0.3–0.9)
Total Bilirubin: 0.8 mg/dL (ref 0.3–1.2)
Total Protein: 5.3 g/dL — ABNORMAL LOW (ref 6.5–8.1)

## 2019-01-30 LAB — MAGNESIUM: Magnesium: 1.9 mg/dL (ref 1.7–2.4)

## 2019-01-30 MED ORDER — HYDROCODONE-ACETAMINOPHEN 5-325 MG PO TABS
1.0000 | ORAL_TABLET | Freq: Four times a day (QID) | ORAL | Status: DC | PRN
  Administered 2019-02-03: 1 via ORAL
  Filled 2019-01-30: qty 1

## 2019-01-30 MED ORDER — ACETAMINOPHEN 325 MG PO TABS: 650.0000 mg | ORAL_TABLET | Freq: Four times a day (QID) | ORAL | Status: DC | PRN

## 2019-01-30 MED ORDER — POTASSIUM CHLORIDE CRYS ER 20 MEQ PO TBCR
20.0000 meq | EXTENDED_RELEASE_TABLET | Freq: Once | ORAL | Status: DC
  Filled 2019-01-30: qty 1

## 2019-01-30 MED ORDER — TRAMADOL HCL 50 MG PO TABS: 50.0000 mg | ORAL_TABLET | Freq: Four times a day (QID) | ORAL | Status: DC | PRN

## 2019-01-30 NOTE — Progress Notes (Signed)
Pt tolerating clear liquid diet so far. No c/o nausea or vomiting.

## 2019-01-30 NOTE — TOC Progression Note (Signed)
Transition of Care [facility]) - Progression Note    Patient Details  Name: Edward Mclaughlin MRN: [id number] Date of Birth: October 17, 1945  Transition of Care [facility]) CM/SW Contact  Edward Sessions, RN Phone Number: 01/30/2019, 4:48 PM  Clinical Narrative:     Level 2 PASRR still pending No currentl bed offers Additional clinical sent to [facility] and rehab in [place].  Her staff is currently reviewing to see if they can accept   Expected Discharge Plan: [name]    Expected Discharge Plan and Services Expected Discharge Plan: [place] arrangements for the past 2 months: Homeless                                       Social Determinants of Health (SDOH) Interventions    Readmission Risk Interventions No flowsheet data found.

## 2019-01-30 NOTE — Progress Notes (Signed)
Upon entering room. Pt found to have taken [place] off. O2 at that time was 74% on RA. Replaced [place]. Pt sats now fluctuating between 85-90 on 4L.

## 2019-01-30 NOTE — Progress Notes (Signed)
PT Cancellation Note  Patient Details Name: Ferguson Gertner MRN: [id number] DOB: 04-Mar-1946   Cancelled Treatment:     Pt sitting EOB upon arrival.  Sats 88% on 4 lpm.  Pt refused attempts at session shaking his head "no."  Pt continued to decline after multiple attempts and redirection at approach.     Chesley Noon 01/30/2019, 12:41 PM

## 2019-01-30 NOTE — Progress Notes (Signed)
Subjective:  CC: Edward Mclaughlin is a 73 y.o. male  Hospital stay day 9, 3 Days Post-Op ex-lap, SB resection for SBO HPI: Episode of desat with [place] off.  Currently complaining of some abdominal pain, no reported flatus or BM  ROS:  General: Denies weight loss, weight gain, fatigue, fevers, chills, and night sweats. Heart: Denies chest pain, palpitations, racing heart, irregular heartbeat, leg pain or swelling, and decreased activity tolerance. Respiratory: Denies breathing difficulty, shortness of breath, wheezing, cough, and sputum. GI: Denies change in appetite, heartburn, constipation, diarrhea, and blood in stool. GU: Denies difficulty urinating, pain with urinating, urgency, frequency, blood in urine.   Objective:   Temp:  [98.1 F (36.7 C)-99.7 F (37.6 C)] 98.1 F (36.7 C) (10/30 0346) Pulse Rate:  [73-91] 73 (10/30 0346) Resp:  [20] 20 (10/30 0346) BP: (120-124)/(72-79) 124/79 (10/30 0346) SpO2:  [74 %-98 %] 89 % (10/30 0830)     Height: 5\' 10"  (177.8 cm) Weight: 82 kg BMI (Calculated): 25.94   Intake/Output this shift:   Intake/Output Summary (Last 24 hours) at 01/30/2019 02/01/2019 Last data filed at 01/30/2019 0800 Gross per 24 hour  Intake 4603.54 ml  Output 350 ml  Net 4253.54 ml    Constitutional :  alert, cooperative, appears stated age and no distress  Respiratory:  decreased breath sounds LLL  Cardiovascular:  regular rate and rhythm  Gastrointestinal: Soft, no guarding, less tender with only focal tenderness now in LUQ.  midline incision c/d/i.    Skin: Cool and moist.   Psychiatric: Normal affect, non-agitated, not confused       LABS:  CMP Latest Ref Rng & Units 01/30/2019 01/28/2019 01/27/2019  Glucose 70 - 99 mg/dL 93 01/29/2019) -  BUN 8 - 23 mg/dL 23 884(Z) -  Creatinine 0.61 - 1.24 mg/dL 66(A 6.30) 1.60(F)  Sodium 135 - 145 mmol/L 143 146(H) -  Potassium 3.5 - 5.1 mmol/L 3.3(L) 3.5 3.3(L)  Chloride 98 - 111 mmol/L 110 103 -  CO2 22 - 32 mmol/L 24 31  -  Calcium 8.9 - 10.3 mg/dL 7.9(L) 8.1(L) -  Total Protein 6.5 - 8.1 g/dL 5.3(L) 5.2(L) 6.8  Total Bilirubin 0.3 - 1.2 mg/dL 0.8 1.1 1.2  Alkaline Phos 38 - 126 U/L 41 37(L) 49  AST 15 - 41 U/L 19 15 15   ALT 0 - 44 U/L 11 12 14    CBC Latest Ref Rng & Units 01/28/2019 01/27/2019 01/26/2019  WBC 4.0 - 10.5 K/uL 10.7(H) 3.5(L) 7.3  Hemoglobin 13.0 - 17.0 g/dL 12.6(L) 13.2 13.6  Hematocrit 39.0 - 52.0 % 38.7(L) 40.7 41.3  Platelets 150 - 400 K/uL 187 173 185    RADS: n/a Assessment:   S/p ex-lap, SB resection for SBO.  Episode of desats despite increased , with decreased breath sounds noted on exam today.  Explained to patient the need for chest x-ray to diagnose possible pneumonia, or atelectasis.  Patient refusing at this time.  I specifically explained to him that if we do not properly diagnose a possible lung etiology can contribute to his continued decline in health.  I asked him if he understands the risks of not proceeding with a chest x-ray, explaining to him the very minimal risks involved with it.  He specifically verbalized "I do not care" and still refused to cooperate on taking a portable chest x-ray.  Abdominal exam is much better today compared to the previous days.  Although he does not report flatus or bowel movements, cannot fully guarantee that  he is able to report back accurately.  With no other concerning signs on physical exam from a GI standpoint, will attempt to start a diet and see how well he tolerates.

## 2019-01-31 LAB — HEPATIC FUNCTION PANEL
ALT: 14 U/L (ref 0–44)
AST: 19 U/L (ref 15–41)
Albumin: 2.1 g/dL — ABNORMAL LOW (ref 3.5–5.0)
Alkaline Phosphatase: 40 U/L (ref 38–126)
Bilirubin, Direct: 0.2 mg/dL (ref 0.0–0.2)
Indirect Bilirubin: 0.6 mg/dL (ref 0.3–0.9)
Total Bilirubin: 0.8 mg/dL (ref 0.3–1.2)
Total Protein: 4.7 g/dL — ABNORMAL LOW (ref 6.5–8.1)

## 2019-01-31 LAB — PHOSPHORUS: Phosphorus: 1.7 mg/dL — ABNORMAL LOW (ref 2.5–4.6)

## 2019-01-31 LAB — MAGNESIUM: Magnesium: 1.6 mg/dL — ABNORMAL LOW (ref 1.7–2.4)

## 2019-02-01 LAB — MAGNESIUM: Magnesium: 1.8 mg/dL (ref 1.7–2.4)

## 2019-02-01 LAB — HEPATIC FUNCTION PANEL
ALT: 15 U/L (ref 0–44)
AST: 21 U/L (ref 15–41)
Albumin: 2.3 g/dL — ABNORMAL LOW (ref 3.5–5.0)
Alkaline Phosphatase: 51 U/L (ref 38–126)
Bilirubin, Direct: 0.3 mg/dL — ABNORMAL HIGH (ref 0.0–0.2)
Indirect Bilirubin: 1.2 mg/dL — ABNORMAL HIGH (ref 0.3–0.9)
Total Bilirubin: 1.5 mg/dL — ABNORMAL HIGH (ref 0.3–1.2)
Total Protein: 5.3 g/dL — ABNORMAL LOW (ref 6.5–8.1)

## 2019-02-01 LAB — PHOSPHORUS: Phosphorus: 2.3 mg/dL — ABNORMAL LOW (ref 2.5–4.6)

## 2019-02-01 MED ORDER — AMOXICILLIN-POT CLAVULANATE 875-125 MG PO TABS
1.0000 | ORAL_TABLET | Freq: Two times a day (BID) | ORAL | Status: DC
  Administered 2019-02-01 – 2019-02-03 (×5): 1 via ORAL
  Filled 2019-02-01 (×5): qty 1

## 2019-02-01 MED ORDER — MAGNESIUM SULFATE 2 GM/50ML IV SOLN
2.0000 g | Freq: Once | INTRAVENOUS | Status: AC
  Administered 2019-02-01: 2 g via INTRAVENOUS
  Filled 2019-02-01: qty 50

## 2019-02-01 MED ORDER — SODIUM PHOSPHATES 45 MMOLE/15ML IV SOLN
20.0000 mmol | Freq: Once | INTRAVENOUS | Status: AC
  Administered 2019-02-01: 20 mmol via INTRAVENOUS
  Filled 2019-02-01: qty 6.67

## 2019-02-01 NOTE — Progress Notes (Signed)
Subjective:  CC: Edward Mclaughlin is a 73 y.o. male  Hospital stay day 11, 5 Days Post-Op ex-lap, SB resection for SBO HPI: No complaints.  Tolerated full liquid yesterday.  ROS:  General: Denies weight loss, weight gain, fatigue, fevers, chills, and night sweats. Heart: Denies chest pain, palpitations, racing heart, irregular heartbeat, leg pain or swelling, and decreased activity tolerance. Respiratory: Denies breathing difficulty, shortness of breath, wheezing, cough, and sputum. GI: Denies change in appetite, heartburn, constipation, diarrhea, and blood in stool. GU: Denies difficulty urinating, pain with urinating, urgency, frequency, blood in urine.   Objective:   Temp:  [97.8 F (36.6 C)-98.3 F (36.8 C)] 97.8 F (36.6 C) (11/01 0441) Pulse Rate:  [71-88] 81 (11/01 0441) Resp:  [18-24] 20 (11/01 0441) BP: (133-149)/(86-90) 140/90 (11/01 0441) SpO2:  [89 %-96 %] 93 % (11/01 0441)     Height: 5\' 10"  (177.8 cm) Weight: 82 kg BMI (Calculated): 25.94   Intake/Output this shift:  No intake or output data in the 24 hours ending 02/01/19 0932  Constitutional :  alert, cooperative, appears stated age and no distress  Respiratory:  decreased breath sounds LLL  Cardiovascular:  regular rate and rhythm  Gastrointestinal: soft, non-tender; bowel sounds normal; no masses,  no organomegaly staple line clean dry and intact..    Skin: Cool and moist.   Psychiatric: Normal affect, non-agitated, not confused       LABS:  CMP Latest Ref Rng & Units 02/01/2019 01/31/2019 01/30/2019  Glucose 70 - 99 mg/dL - - 93  BUN 8 - 23 mg/dL - - 23  Creatinine 0.61 - 1.24 mg/dL - - 0.92  Sodium 135 - 145 mmol/L - - 143  Potassium 3.5 - 5.1 mmol/L - - 3.3(L)  Chloride 98 - 111 mmol/L - - 110  CO2 22 - 32 mmol/L - - 24  Calcium 8.9 - 10.3 mg/dL - - 7.9(L)  Total Protein 6.5 - 8.1 g/dL 5.3(L) 4.7(L) 5.3(L)  Total Bilirubin 0.3 - 1.2 mg/dL 1.5(H) 0.8 0.8  Alkaline Phos 38 - 126 U/L 51 40 41  AST  15 - 41 U/L 21 19 19   ALT 0 - 44 U/L 15 14 11    CBC Latest Ref Rng & Units 01/28/2019 01/27/2019 01/26/2019  WBC 4.0 - 10.5 K/uL 10.7(H) 3.5(L) 7.3  Hemoglobin 13.0 - 17.0 g/dL 12.6(L) 13.2 13.6  Hematocrit 39.0 - 52.0 % 38.7(L) 40.7 41.3  Platelets 150 - 400 K/uL 187 173 185    RADS: n/a Assessment:   S/p ex-lap, SB resection for SBO.    Positive BM, tolerating full liquid.  Advance diet to regular and continue to monitor.  Patient refusing to have staples removed even though we did not have any discussions regarding it.  We will continue to slowly encouraged to prep for removal hopefully prior to discharge.  Pending placement

## 2019-02-01 NOTE — Progress Notes (Signed)
Subjective:  CC: Edward Mclaughlin is a 73 y.o. male  Hospital stay day 11, 5 Days Post-Op ex-lap, SB resection for SBO HPI: Recorded BM yesterday.  No complaints  ROS:  General: Denies weight loss, weight gain, fatigue, fevers, chills, and night sweats. Heart: Denies chest pain, palpitations, racing heart, irregular heartbeat, leg pain or swelling, and decreased activity tolerance. Respiratory: Denies breathing difficulty, shortness of breath, wheezing, cough, and sputum. GI: Denies change in appetite, heartburn, constipation, diarrhea, and blood in stool. GU: Denies difficulty urinating, pain with urinating, urgency, frequency, blood in urine.   Objective:   Temp:  [97.8 F (36.6 C)-98.6 F (37 C)] 97.8 F (36.6 C) (11/01 0441) Pulse Rate:  [71-88] 81 (11/01 0441) Resp:  [16-24] 20 (11/01 0441) BP: (128-149)/(77-90) 140/90 (11/01 0441) SpO2:  [89 %-96 %] 93 % (11/01 0441)     Height: 5\' 10"  (177.8 cm) Weight: 82 kg BMI (Calculated): 25.94   Intake/Output this shift:   Intake/Output Summary (Last 24 hours) at 02/01/2019 0521 Last data filed at 01/31/2019 3818 Gross per 24 hour  Intake -  Output 400 ml  Net -400 ml    Constitutional :  alert, cooperative, appears stated age and no distress  Respiratory:  decreased breath sounds LLL  Cardiovascular:  regular rate and rhythm  Gastrointestinal: Soft, no guarding, less tender with only focal tenderness now in LUQ.  midline incision c/d/i.    Skin: Cool and moist.   Psychiatric: Normal affect, non-agitated, not confused       LABS:  CMP Latest Ref Rng & Units 01/31/2019 01/30/2019 01/28/2019  Glucose 70 - 99 mg/dL - 93 130(H)  BUN 8 - 23 mg/dL - 23 57(H)  Creatinine 0.61 - 1.24 mg/dL - 0.92 1.39(H)  Sodium 135 - 145 mmol/L - 143 146(H)  Potassium 3.5 - 5.1 mmol/L - 3.3(L) 3.5  Chloride 98 - 111 mmol/L - 110 103  CO2 22 - 32 mmol/L - 24 31  Calcium 8.9 - 10.3 mg/dL - 7.9(L) 8.1(L)  Total Protein 6.5 - 8.1 g/dL 4.7(L)  5.3(L) 5.2(L)  Total Bilirubin 0.3 - 1.2 mg/dL 0.8 0.8 1.1  Alkaline Phos 38 - 126 U/L 40 41 37(L)  AST 15 - 41 U/L 19 19 15   ALT 0 - 44 U/L 14 11 12    CBC Latest Ref Rng & Units 01/28/2019 01/27/2019 01/26/2019  WBC 4.0 - 10.5 K/uL 10.7(H) 3.5(L) 7.3  Hemoglobin 13.0 - 17.0 g/dL 12.6(L) 13.2 13.6  Hematocrit 39.0 - 52.0 % 38.7(L) 40.7 41.3  Platelets 150 - 400 K/uL 187 173 185    RADS: n/a Assessment:   S/p ex-lap, SB resection for SBO.    Positive BM, tolerating clears.  Advance diet to fulls and continue to monitor.   Pending placement

## 2019-02-02 LAB — BASIC METABOLIC PANEL
Anion gap: 8 (ref 5–15)
BUN: 17 mg/dL (ref 8–23)
CO2: 26 mmol/L (ref 22–32)
Calcium: 7.4 mg/dL — ABNORMAL LOW (ref 8.9–10.3)
Chloride: 102 mmol/L (ref 98–111)
Creatinine, Ser: 0.75 mg/dL (ref 0.61–1.24)
GFR calc Af Amer: >60 mL/min (ref >60)
GFR calc non Af Amer: >60 mL/min (ref >60)
Glucose, Bld: 86 mg/dL (ref 70–99)
Potassium: 2.5 mmol/L — CL (ref 3.5–5.1)
Sodium: 136 mmol/L (ref 135–145)

## 2019-02-02 LAB — PHOSPHORUS: Phosphorus: 2.2 mg/dL — ABNORMAL LOW (ref 2.5–4.6)

## 2019-02-02 LAB — MAGNESIUM: Magnesium: 1.8 mg/dL (ref 1.7–2.4)

## 2019-02-02 MED ORDER — POTASSIUM CHLORIDE CRYS ER 20 MEQ PO TBCR
40.0000 meq | EXTENDED_RELEASE_TABLET | ORAL | Status: AC
  Administered 2019-02-02 (×3): 40 meq via ORAL
  Filled 2019-02-02 (×3): qty 2

## 2019-02-02 MED ORDER — ENSURE ENLIVE PO LIQD
237.0000 mL | Freq: Three times a day (TID) | ORAL | Status: DC
  Administered 2019-02-02: 237 mL via ORAL

## 2019-02-02 NOTE — Care Management Important Message (Signed)
Important Message  Patient Details  Name: Edward Mclaughlin MRN: [id number] Date of Birth: 05-Jan-1946   Medicare Important Message Given:  Yes     Dannette Barbara 02/02/2019, 11:29 AM

## 2019-02-02 NOTE — TOC Progression Note (Signed)
Transition of Care [facility]) - Progression Note    Patient Details  Name: Edward Mclaughlin MRN: [id number] Date of Birth: [birth year]  Transition of Care [facility]) CM/SW Contact  Beverly Sessions, RN Phone Number: 02/02/2019, 5:22 PM  Clinical Narrative:    PASRR obtained [id number] F  Received call from [place] at [facility] confirming they would be able to offer a bed and accept tomorrow.  Bed offer presented to patient and he accepted.  RNCM attempted to accept in the [place] however it still shows pending.  Doug at [facility] notified.  Awaiting response    Expected Discharge Plan: [place]    Expected Discharge Plan and Services Expected Discharge Plan: [place] arrangements for the past 2 months: Homeless                                       Social Determinants of Health (SDOH) Interventions    Readmission Risk Interventions No flowsheet data found.

## 2019-02-02 NOTE — Progress Notes (Signed)
PT Cancellation Note  Patient Details Name: Tyresse Jayson MRN: [id number] DOB: 1945/11/19   Cancelled Treatment:    Reason Eval/Treat Not Completed: Patient not medically ready. Pts potassium level at 2.5 which is below department critical low of 2.8 for therapy. Pt is not currently appropriate for PT. PT will follow up as able and pt appropriate.   Zachary George PT, DPT 10:57 AM,02/02/19 [phone]

## 2019-02-02 NOTE — Progress Notes (Signed)
Called and spoke with Dr. Lysle Pearl about critical potassium of 2.5. New orders to be entered in by [facility]. Edward Mclaughlin

## 2019-02-02 NOTE — Progress Notes (Signed)
Subjective:  CC: Edward Mclaughlin is a 73 y.o. male  Hospital stay day 12, 6 Days Post-Op ex-lap, SB resection for SBO HPI: No complaints.    ROS:  General: Denies weight loss, weight gain, fatigue, fevers, chills, and night sweats. Heart: Denies chest pain, palpitations, racing heart, irregular heartbeat, leg pain or swelling, and decreased activity tolerance. Respiratory: Denies breathing difficulty, shortness of breath, wheezing, cough, and sputum. GI: Denies change in appetite, heartburn, constipation, diarrhea, and blood in stool. GU: Denies difficulty urinating, pain with urinating, urgency, frequency, blood in urine.   Objective:   Temp:  [97.8 F (36.6 C)-98.2 F (36.8 C)] 97.8 F (36.6 C) (11/02 0544) Pulse Rate:  [71-73] 71 (11/02 0544) Resp:  [16-20] 18 (11/02 0544) BP: (137-145)/(84-90) 145/90 (11/02 0544) SpO2:  [95 %-96 %] 95 % (11/02 0544)     Height: 5\' 10"  (177.8 cm) Weight: 82 kg BMI (Calculated): 25.94   Intake/Output this shift:  No intake or output data in the 24 hours ending 02/02/19 1138  Constitutional :  alert, cooperative, appears stated age and no distress  Respiratory:  decreased breath sounds LLL  Cardiovascular:  regular rate and rhythm  Gastrointestinal: soft, non-tender; bowel sounds normal; no masses,  no organomegaly staple line clean dry and intact..    Skin: Cool and moist.   Psychiatric: Normal affect, non-agitated, not confused       LABS:  CMP Latest Ref Rng & Units 02/02/2019 02/01/2019 01/31/2019  Glucose 70 - 99 mg/dL 86 - -  BUN 8 - 23 mg/dL 17 - -  Creatinine 0.61 - 1.24 mg/dL 0.75 - -  Sodium 135 - 145 mmol/L 136 - -  Potassium 3.5 - 5.1 mmol/L 2.5(LL) - -  Chloride 98 - 111 mmol/L 102 - -  CO2 22 - 32 mmol/L 26 - -  Calcium 8.9 - 10.3 mg/dL 7.4(L) - -  Total Protein 6.5 - 8.1 g/dL - 5.3(L) 4.7(L)  Total Bilirubin 0.3 - 1.2 mg/dL - 1.5(H) 0.8  Alkaline Phos 38 - 126 U/L - 51 40  AST 15 - 41 U/L - 21 19  ALT 0 - 44 U/L - 15  14   CBC Latest Ref Rng & Units 01/28/2019 01/26/2019 01/25/2019  WBC 4.0 - 10.5 K/uL 10.7(H) 7.3 5.0  Hemoglobin 13.0 - 17.0 g/dL 12.6(L) 13.6 13.1  Hematocrit 39.0 - 52.0 % 38.7(L) 41.3 40.2  Platelets 150 - 400 K/uL 187 185 194    RADS: n/a Assessment:   S/p ex-lap, SB resection for SBO.  Resolved. Will remove staples in next day or two  Hypokalemia-replace PRN  Pending placement

## 2019-02-03 LAB — BASIC METABOLIC PANEL
Anion gap: 7 (ref 5–15)
BUN: 15 mg/dL (ref 8–23)
CO2: 28 mmol/L (ref 22–32)
Calcium: 7.8 mg/dL — ABNORMAL LOW (ref 8.9–10.3)
Chloride: 104 mmol/L (ref 98–111)
Creatinine, Ser: 0.65 mg/dL (ref 0.61–1.24)
GFR calc Af Amer: >60 mL/min (ref >60)
GFR calc non Af Amer: >60 mL/min (ref >60)
Glucose, Bld: 110 mg/dL — ABNORMAL HIGH (ref 70–99)
Potassium: 3.3 mmol/L — ABNORMAL LOW (ref 3.5–5.1)
Sodium: 139 mmol/L (ref 135–145)

## 2019-02-03 LAB — SARS CORONAVIRUS 2 (TAT 6-24 HRS): SARS Coronavirus 2: NEGATIVE

## 2019-02-03 LAB — PHOSPHORUS: Phosphorus: 2.2 mg/dL — ABNORMAL LOW (ref 2.5–4.6)

## 2019-02-03 MED ORDER — POTASSIUM CHLORIDE CRYS ER 20 MEQ PO TBCR
40.0000 meq | EXTENDED_RELEASE_TABLET | Freq: Once | ORAL | Status: AC
  Administered 2019-02-03: 40 meq via ORAL
  Filled 2019-02-03: qty 2

## 2019-02-03 NOTE — Discharge Summary (Signed)
Physician Discharge Summary  Patient ID: Edward Mclaughlin MRN: [id number] DOB/AGE: May 30, 1945 73 y.o.  Admit date: 01/21/2019 Discharge date: 02/03/2019  Admission Diagnoses: Small bowel obstruction, altered mental status Discharge Diagnoses:  Same as above  Discharged Condition: good  Hospital Course: Presented for above.  Initially admitted for involuntary commitment due to inability to make medical decisions on her own and no next of kin available.  Initially was refusing all treatment after deemed competent by psych, unfortunately failed nonsurgical treatment, eventually consented to surgical management.  Please see op note for further details.  Postop slowly but surely recovered with eventual return of bowel movement, tolerating regular diet, and pain under control.  Staples were removed the day of discharge, noted to have a small opening at the skin level which was covered with 4 x 4.  This can be removed in 48 hours and then the wound can be covered with a Band-Aid until it heals completely.  Patient is a Medicare recipient therefore placement was initiated by case manager.  Patient is deemed appropriate to be transferred to SNF, Covid test is negative.  Underlying psychiatric disorder remained stable, patient denies any homicidal suicidal intentions, needs some redirecting but is overall amenable to recommendations and follows commands.  Consults: psychiatry  Discharge Exam: Blood pressure 136/82, pulse 75, temperature 98.1 F (36.7 C), temperature source Oral, resp. rate 15, height 5\' 10"  (1.778 m), weight 82 kg, SpO2 97 %. General appearance: alert, cooperative and no distress GI: soft, non-tender; bowel sounds normal; no masses,  no organomegaly and Midline wound clean staples intact.  Staples removed at bedside without any issues noted small opening at the middle portion, clean with no evidence of infection.  Cover with 4 x 4  Disposition:  Discharge disposition: 03-Skilled  [place]       Discharge Instructions    Discharge patient   Complete by: As directed    Discharge disposition: 03-Skilled [place]   Discharge patient date: 02/03/2019     Allergies as of 02/03/2019      Reactions   Penicillins       Medication List    You have not been prescribed any medications.    Follow-up Information    Lysle Pearl, Kirby Cortese, DO Follow up in 2 week(s).   Specialty: Surgery Why: for wound recheck Contact information: [place] [address] [phone]            Total time spent arranging discharge was >34min. Signed: Benjamine Sprague 02/03/2019, 9:20 AM

## 2019-02-03 NOTE — TOC Transition Note (Signed)
Transition of Care [facility]) - CM/SW Discharge Note   Patient Details  Name: Edward Mclaughlin MRN: [id number] Date of Birth: 1945-11-01  Transition of Care [facility]) CM/SW Contact:  Beverly Sessions, RN Phone Number: 02/03/2019, 4:10 PM   Clinical Narrative:     Late Entry  - 2863 am PASRR obtained [id number] F  Patient accepted bed at [facility] [place]  Discharge information sent in [place] [facility] packet on chart Bedside RN notified   Final next level of care: [place] Barriers to Discharge: Barriers Resolved   Patient Goals and CMS Choice        Discharge Placement PASRR number recieved: 02/03/19            Patient chooses bed at: [facility] Patient to be transferred to facility by: EMS Name of family member notified: NA    Discharge Plan and Services                                     Social Determinants of Health (SDOH) Interventions     Readmission Risk Interventions No flowsheet data found.

## 2019-02-03 NOTE — Progress Notes (Signed)
Patient dishcarged via EMS to [facility]. IV's had been removed. Paperwork given to EMS personnel. VSS.

## 2019-02-03 NOTE — Discharge Instructions (Signed)
Bowel resection, Care After This sheet gives you information about how to care for yourself after your procedure. Your health care provider may also give you more specific instructions. If you have problems or questions, contact your health care provider. What can I expect after the procedure? After your procedure, it is common to have the following:  Pain in your abdomen, especially in the incision areas. You will be given medicine to control the pain.  Tiredness. This is a normal part of the recovery process. Your energy level will return to normal over the next several weeks.  Changes in your bowel movements, such as constipation or needing to go more often. Talk with your health care provider about how to manage this. Follow these instructions at home: Medicines   tylenol and advil as needed for discomfort.  Please alternate between the two every four hours as needed for pain.     325-650mg  every 8hrs to max of 4000mg /24hrs (including the 325mg  in every norco dose) for the tylenol.     Advil up to 800mg  per dose every 8hrs as needed for pain.    Incision care     Keep outer dressing on for 48hrs, then ok to remove and keep covered with bandaid until wound heals on its own  Do not wear tight clothing over the incisions. Tight clothing may rub and irritate the incision areas, which may cause the incisions to open.  Do not take baths, swim, or use a hot tub until 4wks from date of procedure. OK TO SHOWER.    Check your incision area every day for signs of infection. Check for: ? More redness, swelling, or pain. ? More fluid or blood. ? Warmth. ? Pus or a bad smell. Activity  Avoid lifting anything that is heavier than 10 lb (4.5 kg) for 4 weeks   You may resume normal activities otherwise  Take rest breaks during the day as needed. Eating and drinking  NO SPECIFIC DIETARY RESTRICTIONS  To prevent or treat constipation while you are taking prescription pain medicine, your  health care provider may recommend that you: ? Drink enough fluid to keep your urine clear or pale yellow. ? Take over-the-counter or prescription medicines. ? Eat foods that are high in fiber, such as fresh fruits and vegetables, whole grains, and beans. ? Limit foods that are high in fat and processed sugars, such as fried and sweet foods.  Contact a health care provider if:  You have more redness, swelling, or pain around your incisions.  You have more fluid or blood coming from the incisions.  Your incisions feel warm to the touch.  You have pus or a bad smell coming from your incisions or your dressing.  You have a fever.  You have an incision that breaks open (edges not staying together) after sutures or staples have been removed. Get help right away if:  You develop a rash.  You have chest pain or difficulty breathing.  You have pain or swelling in your legs.  You feel light-headed or you faint.  Your abdomen swells (becomes distended).  You have nausea or vomiting.  You have blood in your stool (feces). This information is not intended to replace advice given to you by your health care provider. Make sure you discuss any questions you have with your health care provider. Document Released: 10/06/2004 Document Revised: 12/06/2017 Document Reviewed: 12/19/2015 [employer]vier Interactive Patient Education  2019 [employer].

## 2019-02-03 NOTE — Progress Notes (Signed)
RN called report to the [facility]. Report given to Elisha Headland.

## 2024-04-02 DEATH — deceased
